# Patient Record
Sex: Male | Born: 1980 | Race: Black or African American | Hispanic: No | Smoking: Current every day smoker
Health system: Southern US, Community
[De-identification: ages and names within clinical notes are randomized; demographics above are authoritative.]

## PROBLEM LIST (undated history)

## (undated) DIAGNOSIS — M545 Low back pain, unspecified: Secondary | ICD-10-CM

## (undated) DIAGNOSIS — G43909 Migraine, unspecified, not intractable, without status migrainosus: Secondary | ICD-10-CM

## (undated) DIAGNOSIS — G4700 Insomnia, unspecified: Secondary | ICD-10-CM

## (undated) DIAGNOSIS — F329 Major depressive disorder, single episode, unspecified: Secondary | ICD-10-CM

## (undated) DIAGNOSIS — G8929 Other chronic pain: Secondary | ICD-10-CM

## (undated) DIAGNOSIS — F32A Depression, unspecified: Secondary | ICD-10-CM

## (undated) HISTORY — DX: Major depressive disorder, single episode, unspecified: F32.9

## (undated) HISTORY — PX: APPENDECTOMY: SHX54

## (undated) HISTORY — DX: Low back pain, unspecified: M54.50

## (undated) HISTORY — DX: Other chronic pain: G89.29

## (undated) HISTORY — DX: Depression, unspecified: F32.A

## (undated) HISTORY — DX: Migraine, unspecified, not intractable, without status migrainosus: G43.909

## (undated) HISTORY — DX: Low back pain: M54.5

## (undated) HISTORY — DX: Insomnia, unspecified: G47.00

---

## 2013-12-17 HISTORY — PX: CHOLECYSTECTOMY: SHX55

## 2014-05-15 ENCOUNTER — Ambulatory Visit: Payer: BC Managed Care – PPO | Attending: Internal Medicine | Admitting: Physical Therapy

## 2014-05-15 DIAGNOSIS — Z9089 Acquired absence of other organs: Secondary | ICD-10-CM | POA: Insufficient documentation

## 2014-05-15 DIAGNOSIS — IMO0001 Reserved for inherently not codable concepts without codable children: Secondary | ICD-10-CM | POA: Insufficient documentation

## 2014-05-15 DIAGNOSIS — R5381 Other malaise: Secondary | ICD-10-CM | POA: Diagnosis not present

## 2014-05-15 DIAGNOSIS — M542 Cervicalgia: Secondary | ICD-10-CM | POA: Diagnosis not present

## 2014-05-17 ENCOUNTER — Ambulatory Visit: Payer: BC Managed Care – PPO | Attending: Internal Medicine | Admitting: *Deleted

## 2014-05-17 DIAGNOSIS — Z9089 Acquired absence of other organs: Secondary | ICD-10-CM | POA: Insufficient documentation

## 2014-05-17 DIAGNOSIS — R5381 Other malaise: Secondary | ICD-10-CM | POA: Diagnosis not present

## 2014-05-17 DIAGNOSIS — M542 Cervicalgia: Secondary | ICD-10-CM | POA: Diagnosis not present

## 2014-05-17 DIAGNOSIS — IMO0001 Reserved for inherently not codable concepts without codable children: Secondary | ICD-10-CM | POA: Diagnosis present

## 2014-05-22 ENCOUNTER — Encounter: Payer: BC Managed Care – PPO | Admitting: *Deleted

## 2014-05-29 ENCOUNTER — Encounter: Payer: BC Managed Care – PPO | Admitting: *Deleted

## 2014-05-31 ENCOUNTER — Encounter: Payer: BC Managed Care – PPO | Admitting: Physical Therapy

## 2014-08-27 ENCOUNTER — Telehealth: Payer: Self-pay | Admitting: Family Medicine

## 2014-08-27 NOTE — Telephone Encounter (Signed)
Pt states he did not call our office for appt.

## 2014-08-28 NOTE — Telephone Encounter (Addendum)
Patient has [employer]- he is currently taking nortriptyline, hydrocodone, dr. Daleen SquibbWall the neurologist prescribes the nortriptyline and hydrocodone came from urgent care. He was seeing a primary care in [place] and has moved to [place] and wants to establish a pcp here in town.  Appointment given for 1/27 with Dr. Hyacinth MeekerMiller and patient will bring records with him

## 2014-09-03 ENCOUNTER — Ambulatory Visit: Payer: BC Managed Care – PPO | Attending: Neurology | Admitting: Physical Therapy

## 2014-09-03 DIAGNOSIS — M545 Low back pain: Secondary | ICD-10-CM | POA: Insufficient documentation

## 2014-09-11 ENCOUNTER — Encounter: Payer: BC Managed Care – PPO | Admitting: *Deleted

## 2014-09-12 ENCOUNTER — Ambulatory Visit: Payer: BC Managed Care – PPO | Admitting: Physical Therapy

## 2014-09-12 DIAGNOSIS — M545 Low back pain: Secondary | ICD-10-CM | POA: Diagnosis not present

## 2014-09-18 ENCOUNTER — Encounter: Payer: BC Managed Care – PPO | Admitting: Physical Therapy

## 2014-09-25 ENCOUNTER — Ambulatory Visit: Payer: BC Managed Care – PPO | Attending: Neurology | Admitting: Physical Therapy

## 2014-09-25 DIAGNOSIS — M545 Low back pain: Secondary | ICD-10-CM | POA: Insufficient documentation

## 2014-11-14 ENCOUNTER — Ambulatory Visit (INDEPENDENT_AMBULATORY_CARE_PROVIDER_SITE_OTHER): Payer: BLUE CROSS/BLUE SHIELD | Admitting: Family Medicine

## 2014-11-14 ENCOUNTER — Encounter: Payer: Self-pay | Admitting: Family Medicine

## 2014-11-14 VITALS — BP 110/69 | HR 78 | Temp 98.7°F | Ht 72.0 in | Wt 204.0 lb

## 2014-11-14 DIAGNOSIS — G8929 Other chronic pain: Secondary | ICD-10-CM | POA: Insufficient documentation

## 2014-11-14 DIAGNOSIS — M545 Low back pain, unspecified: Secondary | ICD-10-CM

## 2014-11-14 DIAGNOSIS — G43909 Migraine, unspecified, not intractable, without status migrainosus: Secondary | ICD-10-CM | POA: Insufficient documentation

## 2014-11-14 DIAGNOSIS — G44229 Chronic tension-type headache, not intractable: Secondary | ICD-10-CM

## 2014-11-14 DIAGNOSIS — G4700 Insomnia, unspecified: Secondary | ICD-10-CM | POA: Insufficient documentation

## 2014-11-14 MED ORDER — CYCLOBENZAPRINE HCL 10 MG PO TABS: 10.0000 mg | ORAL_TABLET | Freq: Every day | ORAL | Status: DC
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
## 2014-12-07 ENCOUNTER — Encounter: Payer: Self-pay | Admitting: Family Medicine

## 2014-12-07 ENCOUNTER — Telehealth: Payer: Self-pay | Admitting: Family Medicine

## 2014-12-07 ENCOUNTER — Ambulatory Visit (INDEPENDENT_AMBULATORY_CARE_PROVIDER_SITE_OTHER): Payer: BLUE CROSS/BLUE SHIELD | Admitting: Family Medicine

## 2014-12-07 VITALS — BP 114/70 | HR 89 | Temp 98.2°F | Ht 72.0 in | Wt 198.0 lb

## 2014-12-07 DIAGNOSIS — M545 Low back pain: Secondary | ICD-10-CM

## 2014-12-07 DIAGNOSIS — G8929 Other chronic pain: Secondary | ICD-10-CM

## 2014-12-07 NOTE — Progress Notes (Signed)
   Subjective:    Patient ID: Logan Young, male    DOB: 17-Jun-1981, 34 y.o.   MRN: [id number]  HPI  34 year old gentleman here to follow-up back pain. Since his last visit he has been taking muscle relaxant, Flexeril at bedtime. He says that has helped and the pain is better but he still works a very strenuous job and I think some muscular aches and pains could be expected. The headaches which she described as migraines have definitely improved and are much less frequent on the nortriptyline that neurologist has given him. Today he talks about some left-sided chest pain. He does smoke a pack a day but has no other risk factors for heart disease. The chest pain is not necessarily related to exertion. There is no cough or congestion.  Patient Active Problem List   Diagnosis Date Noted  . Migraines   . Insomnia   . Chronic low back pain    Outpatient Encounter Prescriptions as of 12/07/2014  Medication Sig  . cyclobenzaprine (FLEXERIL) 10 MG tablet Take 1 tablet (10 mg total) by mouth at bedtime.  . nortriptyline (PAMELOR) 25 MG capsule Take 50 mg by mouth at bedtime.     Review of Systems  Cardiovascular: Positive for chest pain.  Musculoskeletal: Positive for back pain.  Neurological: Positive for headaches.       Objective:   Physical Exam  Constitutional: He is oriented to person, place, and time. He appears well-developed and well-nourished.  HENT:  Head: Normocephalic.  Right Ear: External ear normal.  Left Ear: External ear normal.  Nose: Nose normal.  Mouth/Throat: Oropharynx is clear and moist.  Eyes: Conjunctivae and EOM are normal. Pupils are equal, round, and reactive to light.  Neck: Normal range of motion. Neck supple.  Cardiovascular: Normal rate, regular rhythm, normal heart sounds and intact distal pulses.   Pulmonary/Chest: Effort normal and breath sounds normal.  Abdominal: Soft. Bowel sounds are normal.  Musculoskeletal: Normal range of motion.  Back exam  shows good range of motion. Straight leg testing is negative. Deep tendon reflexes are symmetric in lower extremities.  Neurological: He is alert and oriented to person, place, and time.  Skin: Skin is warm and dry.  Psychiatric: He has a normal mood and affect. His behavior is normal. Judgment and thought content normal.     BP 114/70 mmHg  Pulse 89  Temp(Src) 98.2 F (36.8 C) (Oral)  Ht 6' (1.829 m)  Wt 198 lb (89.812 kg)  BMI 26.85 kg/m2      Assessment & Plan:  1. Chronic low back pain Improved.  Rec stretching exercises  2. Chest pain Tender pectoral muscles.  More likely musculoskeletal pain but did rec stop smoking  Frederica KusterStephen M Miller MD

## 2014-12-07 NOTE — Telephone Encounter (Signed)
Stp advised we don't need to schedule a follow up appt for back pain. The pt can call to schedule appt as needed for the back pain, pt voiced understanding.

## 2015-01-08 ENCOUNTER — Telehealth: Payer: Self-pay | Admitting: Family Medicine

## 2015-01-08 NOTE — Telephone Encounter (Signed)
Lmtb/ww girlfriend is not on Hipp[employer]

## 2015-02-12 ENCOUNTER — Ambulatory Visit (INDEPENDENT_AMBULATORY_CARE_PROVIDER_SITE_OTHER): Payer: BLUE CROSS/BLUE SHIELD | Admitting: Family Medicine

## 2015-02-12 ENCOUNTER — Encounter: Payer: Self-pay | Admitting: Family Medicine

## 2015-02-12 VITALS — BP 121/79 | HR 79 | Temp 97.8°F | Ht 72.0 in | Wt 203.0 lb

## 2015-02-12 DIAGNOSIS — F329 Major depressive disorder, single episode, unspecified: Secondary | ICD-10-CM | POA: Insufficient documentation

## 2015-02-12 DIAGNOSIS — F32A Depression, unspecified: Secondary | ICD-10-CM | POA: Insufficient documentation

## 2015-02-12 MED ORDER — CITALOPRAM HYDROBROMIDE 20 MG PO TABS: 20.0000 mg | ORAL_TABLET | Freq: Every day | ORAL | Status: DC

## 2015-02-12 NOTE — Progress Notes (Signed)
   Subjective:    Patient ID: Logan Young, male    DOB: [birth year], 34 y.o.   MRN: [id number]  HPI 34 year old gentleman with depression. At his first visit we focused on his headaches which seem to be more tension related but definitely improved now nortriptyline. He may get a mild headache once a week but certainly frequency has decreased from 2-3 times a week as well as severity of the headache.  Today he describes problems sleeping, mind racing, temper outbursts and general apathy and lack of interest. There is no family history of depression or bipolar disorder although he did does state that his father was temperamental    Review of Systems  Constitutional: Negative.   HENT: Negative.   Eyes: Negative.   Respiratory: Negative.  Negative for shortness of breath.   Cardiovascular: Negative.  Negative for chest pain and leg swelling.  Gastrointestinal: Negative.   Genitourinary: Negative.   Musculoskeletal: Negative.   Skin: Negative.   Neurological: Negative.   Psychiatric/Behavioral: Positive for decreased concentration and agitation.  All other systems reviewed and are negative.  Patient Active Problem List   Diagnosis Date Noted  . Depression   . Migraines   . Insomnia   . Chronic low back pain    Outpatient Encounter Prescriptions as of 02/12/2015  Medication Sig  . cyclobenzaprine (FLEXERIL) 10 MG tablet Take 1 tablet (10 mg total) by mouth at bedtime.  . nortriptyline (PAMELOR) 25 MG capsule Take 50 mg by mouth at bedtime.  . traMADol (ULTRAM) 50 MG tablet Take 50 mg by mouth daily.      Objective:   Physical Exam  Constitutional: He is oriented to person, place, and time. He appears well-developed and well-nourished.  HENT:  Head: Normocephalic.  Right Ear: External ear normal.  Left Ear: External ear normal.  Nose: Nose normal.  Mouth/Throat: Oropharynx is clear and moist.  Eyes: Conjunctivae and EOM are normal. Pupils are equal, round, and reactive to light.    Neck: Normal range of motion. Neck supple.  Cardiovascular: Normal rate, regular rhythm, normal heart sounds and intact distal pulses.   Pulmonary/Chest: Effort normal and breath sounds normal.  Abdominal: Soft. Bowel sounds are normal.  Musculoskeletal: Normal range of motion.  Neurological: He is alert and oriented to person, place, and time.  Skin: Skin is warm and dry.  Psychiatric: He has a normal mood and affect. His behavior is normal. Judgment and thought content normal.          Assessment & Plan:  1. Depression Patient seems clinically depressed. Appointments have been made with psychologist in the past on several occasions that he did not keep. He says that he is willing to take an antidepressant today but also have my doubts that he will follow through with this. We'll begin Celexa 20 mg daily and recheck in one month  Logan KusterStephen M Miller MD

## 2015-03-04 ENCOUNTER — Telehealth: Payer: Self-pay | Admitting: Family Medicine

## 2015-03-04 NOTE — Telephone Encounter (Signed)
Patient has chronic back pain and felt a pop while at work last week.  He was taken to the ER and evaluated. They told him to follow up with his PCP and that an MRI may be needed. Appt scheduled for 5/19 at 10:15. Patient aware. Explained that this is a gray area as far as coverage is concerned since there was a preexisting back condition I will schedule this as a private visit and not WC.

## 2015-03-07 ENCOUNTER — Encounter: Payer: Self-pay | Admitting: Family Medicine

## 2015-03-07 ENCOUNTER — Ambulatory Visit (INDEPENDENT_AMBULATORY_CARE_PROVIDER_SITE_OTHER): Payer: BLUE CROSS/BLUE SHIELD | Admitting: Family Medicine

## 2015-03-07 VITALS — BP 119/75 | HR 88 | Temp 99.1°F | Ht 72.0 in | Wt 204.0 lb

## 2015-03-07 DIAGNOSIS — M545 Low back pain: Secondary | ICD-10-CM
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
## 2015-03-13 ENCOUNTER — Telehealth: Payer: Self-pay | Admitting: Family Medicine

## 2015-03-13 NOTE — Telephone Encounter (Signed)
?  does he need to be seen or provide a note saying pt states he is ready to return

## 2015-03-13 NOTE — Telephone Encounter (Signed)
Patient is requesting note to return back to work on Sunday with no restrictions

## 2015-03-14 NOTE — Telephone Encounter (Signed)
This message is being handled in separate encounter.

## 2015-03-14 NOTE — Telephone Encounter (Signed)
Patient states that he needs a note to return to work even though he will continue to have him on light duty. They just need note stating he may return to work since it has been over 7 days

## 2015-03-19 ENCOUNTER — Ambulatory Visit: Payer: BLUE CROSS/BLUE SHIELD | Admitting: Family Medicine

## 2015-04-03 ENCOUNTER — Telehealth: Payer: Self-pay | Admitting: Family Medicine

## 2015-04-03 NOTE — Telephone Encounter (Signed)
We did not see him as a worker's comp patient unless things have changed since his visit.

## 2015-04-03 NOTE — Telephone Encounter (Signed)
Please review and advise.

## 2015-04-04 ENCOUNTER — Encounter (INDEPENDENT_AMBULATORY_CARE_PROVIDER_SITE_OTHER): Payer: Self-pay

## 2015-04-04 ENCOUNTER — Encounter: Payer: Self-pay | Admitting: Family Medicine

## 2015-04-04 ENCOUNTER — Ambulatory Visit (INDEPENDENT_AMBULATORY_CARE_PROVIDER_SITE_OTHER): Payer: BLUE CROSS/BLUE SHIELD | Admitting: Family Medicine

## 2015-04-04 VITALS — BP 124/83 | HR 81 | Temp 98.8°F | Ht 72.0 in | Wt 211.0 lb

## 2015-04-04 DIAGNOSIS — M545 Low back pain: Secondary | ICD-10-CM

## 2015-04-04 DIAGNOSIS — G8929 Other chronic pain: Secondary | ICD-10-CM | POA: Diagnosis not present

## 2015-04-04 NOTE — Progress Notes (Signed)
   Subjective:    Patient ID: Logan Young, male    DOB: [birth year], 34 y.o.   MRN: [id number]  HPI 34 year old gentleman with persistent back pain. 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 He felt pain immediately in her a pop. He was seen here on May 19. Apparently, Worker's Comp. denied his claim and he has been doing light duty. He still has some persistence of right lumbar pain and radiation to his left leg. He had called yesterday requesting to go back to full-time duty but I felt he needed to be seen before we can release him. He had thought an MRI would be scheduled but since claim was denied by [employer]. and there was some pre-existing back pain there was also a question as to whether or not it would be covered by his [employer].    Review of Systems  Musculoskeletal: Positive for back pain.  Neurological: Positive for headaches.   Patient Active Problem List   Diagnosis Date Noted  . Depression   . Migraines   . Insomnia   . Chronic low back pain    Outpatient Encounter Prescriptions as of 04/04/2015  Medication Sig  . citalopram (CELEXA) 20 MG tablet Take 1 tablet (20 mg total) by mouth daily.  . cyclobenzaprine (FLEXERIL) 10 MG tablet Take 1 tablet (10 mg total) by mouth at bedtime.  . nortriptyline (PAMELOR) 25 MG capsule Take 50 mg by mouth at bedtime.  . traMADol (ULTRAM) 50 MG tablet Take 50 mg by mouth daily.   No facility-administered encounter medications on file as of 04/04/2015.       Objective:   Physical Exam  Musculoskeletal:  Back exam: Normal for flexion. Some pain with lateral bending to left. Straight leg raising equivocal old on left. Deep tendon reflexes are symmetric, at the knees but there is a diminished left ankle jerk reflex compared to the right          Assessment & Plan:  1. Chronic low back pain This is a difficult problem. He probably did suffer a back injury at work but the  claim was denied. I am reluctant to release him to full-time duty given persistence of pain. Since symptoms have persisted greater than one month I will try to order MRI.

## 2015-04-08 ENCOUNTER — Telehealth: Payer: Self-pay | Admitting: Family Medicine

## 2015-04-08 DIAGNOSIS — G8929 Other chronic pain: Secondary | ICD-10-CM

## 2015-04-08 DIAGNOSIS — M545 Low back pain: Principal | ICD-10-CM

## 2015-04-08 NOTE — Telephone Encounter (Signed)
Dr. Hyacinth Meeker please advise.  Left another voicemail at [facility] this morning to determine status of worker's comp.

## 2015-04-08 NOTE — Telephone Encounter (Signed)
Pt notified to Dr. Hyacinth Meeker ordered MRI- and he should receive a call to schedule the appointment.  Per Dr. Hyacinth Meeker- patient can try taking 800 mg ibuprofen along with tramadol to see if this helps with his pain.  He will do a work note that says he cannot return to regular duty right now.

## 2015-04-08 NOTE — Telephone Encounter (Signed)
Girlfriend call and said that Tramadol isn't working. Patient is pacing in pain.  Would like something stronger for back pain.  Also says that he needs a note stating that he can't return to regular duty.

## 2015-04-08 NOTE — Telephone Encounter (Signed)
Since MRI will not be covered by workers comp, let's see if we can order one through his other health insurance

## 2015-04-09 NOTE — Telephone Encounter (Signed)
Faxed to employer per patient's request. [phone]

## 2015-04-15 ENCOUNTER — Telehealth: Payer: Self-pay | Admitting: Family Medicine

## 2015-05-01 ENCOUNTER — Telehealth: Payer: Self-pay | Admitting: Family Medicine

## 2015-05-01 ENCOUNTER — Ambulatory Visit (HOSPITAL_COMMUNITY)
Admission: RE | Admit: 2015-05-01 | Discharge: 2015-05-01 | Disposition: A | Discharge status: Home / Self Care | Payer: BLUE CROSS/BLUE SHIELD | Source: Ambulatory Visit | Attending: Family Medicine | Admitting: Family Medicine

## 2015-05-01 DIAGNOSIS — M545 Low back pain, unspecified: Secondary | ICD-10-CM

## 2015-05-01 DIAGNOSIS — G8929 Other chronic pain: Secondary | ICD-10-CM | POA: Diagnosis not present

## 2015-05-02 NOTE — Telephone Encounter (Signed)
-----   Message from Frederica KusterStephen M Miller, MD sent at 05/02/2015  7:58 AM EDT ----- He has some disc protrusions but no surgical lesion

## 2015-05-03 ENCOUNTER — Telehealth: Payer: Self-pay | Admitting: Family Medicine

## 2015-05-03 NOTE — Telephone Encounter (Signed)
I think we can release him to go back to work with the knowledge that his back symptoms will probably recur intermittently given results of MRI

## 2015-05-03 NOTE — Telephone Encounter (Signed)
Pt notified of MRI results Can he go back to work Please advise

## 2015-05-06 ENCOUNTER — Telehealth: Payer: Self-pay | Admitting: Family Medicine

## 2015-05-06 NOTE — Telephone Encounter (Signed)
Appointment given for tomorrow with Logan Young

## 2015-05-07 ENCOUNTER — Encounter: Payer: Self-pay | Admitting: Family Medicine

## 2015-05-07 ENCOUNTER — Encounter: Payer: Self-pay | Admitting: *Deleted

## 2015-05-07 ENCOUNTER — Ambulatory Visit (INDEPENDENT_AMBULATORY_CARE_PROVIDER_SITE_OTHER): Payer: BLUE CROSS/BLUE SHIELD | Admitting: Family Medicine

## 2015-05-07 VITALS — BP 125/83 | HR 81 | Temp 97.7°F | Ht 72.0 in | Wt 213.0 lb

## 2015-05-07 DIAGNOSIS — M519 Unspecified thoracic, thoracolumbar and lumbosacral intervertebral disc disorder: Secondary | ICD-10-CM | POA: Diagnosis not present

## 2015-05-07 NOTE — Progress Notes (Signed)
   Subjective:    Patient ID: Logan MattersRobert Sherrod, male    DOB: October 19, 1981, 34 y.o.   MRN: [id number]  HPI Patient here today to discuss back pain and the need for a return to work note.  Patient is ready to go back to work. We finally got MRI that demonstrates discs at several levels in his back I explained to him that symptoms may come and go and he needs to practice good back hygiene is much as possible but he does have a strenuous work on the job.      Patient Active Problem List   Diagnosis Date Noted  . Depression   . Migraines   . Insomnia   . Chronic low back pain    Outpatient Encounter Prescriptions as of 05/07/2015  Medication Sig  . citalopram (CELEXA) 20 MG tablet Take 1 tablet (20 mg total) by mouth daily.  . cyclobenzaprine (FLEXERIL) 10 MG tablet Take 1 tablet (10 mg total) by mouth at bedtime.  . nortriptyline (PAMELOR) 25 MG capsule Take 50 mg by mouth at bedtime.  . traMADol (ULTRAM) 50 MG tablet Take 50 mg by mouth daily.   No facility-administered encounter medications on file as of 05/07/2015.      Review of Systems  Constitutional: Negative.   HENT: Negative.   Eyes: Negative.   Respiratory: Negative.   Cardiovascular: Negative.   Gastrointestinal: Negative.   Endocrine: Negative.   Genitourinary: Negative.   Musculoskeletal: Positive for back pain.  Skin: Negative.   Allergic/Immunologic: Negative.   Neurological: Negative.   Hematological: Negative.   Psychiatric/Behavioral: Negative.        Objective:   Physical Exam  Constitutional: He appears well-developed and well-nourished.  Musculoskeletal:  Back exam: Normal range of motion. Straight leg raising is negative Deep tendon reflexes are symmetric at the knees and ankles.    BP 125/83 mmHg  Pulse 81  Temp(Src) 97.7 F (36.5 C) (Oral)  Ht 6' (1.829 m)  Wt 213 lb (96.616 kg)  BMI 28.88 kg/m2       Assessment & Plan:  1. Lumbar disc disease Will return to work with warning that  symptoms may recur. If he develops radiculopathy or has any limitation to function particularly in the lower extremities he needs to be seen immediately  Frederica KusterStephen M Miller MD

## 2015-05-08 ENCOUNTER — Telehealth: Payer: Self-pay | Admitting: Family Medicine

## 2015-05-10 NOTE — Telephone Encounter (Signed)
Patient will have [employer] send forms.  We have no forms to fill out but will prepare and give to patient or send back to insurance.  Patient returned to work on 05-09-2015.  He needs documentation to cover through the [address] for out of work.

## 2015-05-10 NOTE — Telephone Encounter (Signed)
Patient is aware of results and returned to work on 05-09-2015.

## 2015-06-12 ENCOUNTER — Encounter: Payer: Self-pay | Admitting: Family Medicine

## 2015-06-12 ENCOUNTER — Ambulatory Visit (INDEPENDENT_AMBULATORY_CARE_PROVIDER_SITE_OTHER): Payer: BLUE CROSS/BLUE SHIELD | Admitting: Family Medicine

## 2015-06-12 VITALS — BP 118/80 | HR 76 | Temp 97.4°F | Ht 72.0 in | Wt 215.0 lb

## 2015-06-12 DIAGNOSIS — F32A Depression, unspecified: Secondary | ICD-10-CM

## 2015-06-12 DIAGNOSIS — F329 Major depressive disorder, single episode, unspecified: Secondary | ICD-10-CM | POA: Diagnosis not present

## 2015-06-12 DIAGNOSIS — M545 Low back pain, unspecified: Secondary | ICD-10-CM | POA: Insufficient documentation

## 2015-06-12 MED ORDER — TIZANIDINE HCL 2 MG PO CAPS: 2.0000 mg | ORAL_CAPSULE | Freq: Three times a day (TID) | ORAL | Status: DC

## 2015-06-12 MED ORDER — CITALOPRAM HYDROBROMIDE 20 MG PO TABS: 20.0000 mg | ORAL_TABLET | Freq: Every day | ORAL | Status: DC

## 2015-06-12 MED ORDER — TRAMADOL HCL 50 MG PO TABS: 50.0000 mg | ORAL_TABLET | Freq: Every day | ORAL | Status: DC

## 2015-06-12 NOTE — Patient Instructions (Signed)
Back Pain, Adult Low back pain is very common. About 1 in 5 people have back pain.The cause of low back pain is rarely dangerous. The pain often gets better over time.About half of people with a sudden onset of back pain feel better in just 2 weeks. About 8 in 10 people feel better by 6 weeks.  CAUSES Some common causes of back pain include:  Strain of the muscles or ligaments supporting the spine.  Wear and tear (degeneration) of the spinal discs.  Arthritis.  Direct injury to the back. DIAGNOSIS Most of the time, the direct cause of low back pain is not known.However, back pain can be treated effectively even when the exact cause of the pain is unknown.Answering your caregiver's questions about your overall health and symptoms is one of the most accurate ways to make sure the cause of your pain is not dangerous. If your caregiver needs more information, he or she may order lab work or imaging tests (X-rays or MRIs).However, even if imaging tests show changes in your back, this usually does not require surgery. HOME CARE INSTRUCTIONS For many people, back pain returns.Since low back pain is rarely dangerous, it is often a condition that people can learn to manageon their own.   Remain active. It is stressful on the back to sit or stand in one place. Do not sit, drive, or stand in one place for more than 30 minutes at a time. Take short walks on level surfaces as soon as pain allows.Try to increase the length of time you walk each day.  Do not stay in bed.Resting more than 1 or 2 days can delay your recovery.  Do not avoid exercise or work.Your body is made to move.It is not dangerous to be active, even though your back may hurt.Your back will likely heal faster if you return to being active before your pain is gone.  Pay attention to your body when you bend and lift. Many people have less discomfortwhen lifting if they bend their knees, keep the load close to their bodies,and  avoid twisting. Often, the most comfortable positions are those that put less stress on your recovering back.  Find a comfortable position to sleep. Use a firm mattress and lie on your side with your knees slightly bent. If you lie on your back, put a pillow under your knees.  Only take over-the-counter or prescription medicines as directed by your caregiver. Over-the-counter medicines to reduce pain and inflammation are often the most helpful.Your caregiver may prescribe muscle relaxant drugs.These medicines help dull your pain so you can more quickly return to your normal activities and healthy exercise.  Put ice on the injured area.  Put ice in a plastic bag.  Place a towel between your skin and the bag.  Leave the ice on for 15-20 minutes, 03-04 times a day for the first 2 to 3 days. After that, ice and heat may be alternated to reduce pain and spasms.  Ask your caregiver about trying back exercises and gentle massage. This may be of some benefit.  Avoid feeling anxious or stressed.Stress increases muscle tension and can worsen back pain.It is important to recognize when you are anxious or stressed and learn ways to manage it.Exercise is a great option. SEEK MEDICAL CARE IF:  You have pain that is not relieved with rest or medicine.  You have pain that does not improve in 1 week.  You have new symptoms.  You are generally not feeling well. SEEK   IMMEDIATE MEDICAL CARE IF:   You have pain that radiates from your back into your legs.  You develop new bowel or bladder control problems.  You have unusual weakness or numbness in your arms or legs.  You develop nausea or vomiting.  You develop abdominal pain.  You feel faint. Document Released: 10/05/2005 Document Revised: 04/05/2012 Document Reviewed: 02/06/2014 Exit[employer] Patient Information 2015 [employer]. This information is not intended to replace advice given to you by your health care [place]. Make sure you  discuss any questions you have with your health care Treylin Burtch.  

## 2015-06-12 NOTE — Assessment & Plan Note (Signed)
Patient admits to having some component of depression because of his back pain his work situation. He denies any suicidal ideations and is currently happy with Celexa. We'll refill

## 2015-06-12 NOTE — Assessment & Plan Note (Signed)
Patient has been having this pain for the past 2 years. Attempted short courses of physical therapy twice and has tried different medications without much success. Discussed the need for daily exercises and stretches and physical therapy. He is hesitant towards physical therapy because of the pain that they cause, instructed him that there does have to be some pain initially to be worked through before it will improve but he needs to stick with it. We'll also try Zanaflex and tramadol.
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
## 2015-06-20 ENCOUNTER — Telehealth: Payer: Self-pay | Admitting: *Deleted

## 2015-06-20 DIAGNOSIS — M545 Low back pain, unspecified: Secondary | ICD-10-CM

## 2015-06-20 DIAGNOSIS — G8929 Other chronic pain: Secondary | ICD-10-CM

## 2015-06-20 NOTE — Telephone Encounter (Signed)
Okay to refer to pain clinic

## 2015-11-18 ENCOUNTER — Ambulatory Visit (INDEPENDENT_AMBULATORY_CARE_PROVIDER_SITE_OTHER): Payer: BLUE CROSS/BLUE SHIELD | Admitting: Family Medicine

## 2015-11-18 ENCOUNTER — Encounter: Payer: Self-pay | Admitting: Family Medicine

## 2015-11-18 VITALS — BP 107/71 | HR 81 | Temp 98.2°F | Ht 72.0 in | Wt 205.8 lb

## 2015-11-18 DIAGNOSIS — J029 Acute pharyngitis, unspecified: Secondary | ICD-10-CM | POA: Diagnosis not present

## 2015-11-18 DIAGNOSIS — Z029 Encounter for administrative examinations, unspecified: Secondary | ICD-10-CM

## 2015-11-18 MED ORDER — FLUTICASONE PROPIONATE 50 MCG/ACT NA SUSP: 1.0000 | Freq: Two times a day (BID) | NASAL | Status: DC | PRN

## 2015-11-18 MED ORDER — TIZANIDINE HCL 2 MG PO CAPS: 2.0000 mg | ORAL_CAPSULE | Freq: Three times a day (TID) | ORAL | Status: DC

## 2015-11-18 NOTE — Progress Notes (Signed)
BP 107/71 mmHg  Pulse 81  Temp(Src) 98.2 F (36.8 C) (Oral)  Ht 6' (1.829 m)  Wt 205 lb 12.8 oz (93.35 kg)  BMI 27.91 kg/m2   Subjective:    Patient ID: Logan Young, male    DOB: [birth year], 35 y.o.   MRN: [id number]  HPI: Logan Young is a 35 y.o. male presenting on 11/18/2015 for Cough; Fever; Sinusitis; and Generalized Body Aches   HPI Sinus congestion and cough and body aches Patient has been having sinus congestion and cough and fevers for the past 5 days. His fever broke finally 2 days ago and he has not had any fever since. He just been having the sinus congestion and cough since that point. He also had myalgias and achiness. He does not know any sick contacts. He has been using some over-the-counter DayQuil and NyQuil and they have been helping some. He is also been using ibuprofen and Advil for the fevers. He does not have any shortness of breath or wheezing  Relevant past medical, surgical, family and social history reviewed and updated as indicated. Interim medical history since our last visit reviewed. Allergies and medications reviewed and updated.  Review of Systems  Constitutional: Positive for fever and chills.  HENT: Positive for congestion, postnasal drip, rhinorrhea, sinus pressure and sore throat. Negative for ear discharge, ear pain, sneezing and voice change.   Eyes: Negative for pain, discharge, redness and visual disturbance.  Respiratory: Positive for cough. Negative for chest tightness, shortness of breath and wheezing.   Cardiovascular: Negative for chest pain and leg swelling.  Gastrointestinal: Negative for abdominal pain, diarrhea and constipation.  Genitourinary: Negative for difficulty urinating.  Musculoskeletal: Negative for back pain and gait problem.  Skin: Negative for rash.  Neurological: Negative for syncope, light-headedness and headaches.  All other systems reviewed and are negative.   Per HPI unless specifically indicated above       Medication List       This list is accurate as of: 11/18/15 11:42 AM.  Always use your most recent med list.               fluticasone 50 MCG/ACT nasal spray  Commonly known as:  FLONASE  Place 1 spray into both nostrils 2 (two) times daily as needed for allergies or rhinitis.     tizanidine 2 MG capsule  Commonly known as:  ZANAFLEX  Take 1 capsule (2 mg total) by mouth 3 (three) times daily.           Objective:    BP 107/71 mmHg  Pulse 81  Temp(Src) 98.2 F (36.8 C) (Oral)  Ht 6' (1.829 m)  Wt 205 lb 12.8 oz (93.35 kg)  BMI 27.91 kg/m2  Wt Readings from Last 3 Encounters:  11/18/15 205 lb 12.8 oz (93.35 kg)  06/12/15 215 lb (97.523 kg)  05/07/15 213 lb (96.616 kg)    Physical Exam  Constitutional: He is oriented to person, place, and time. He appears well-developed and well-nourished. No distress.  HENT:  Right Ear: Tympanic membrane, external ear and ear canal normal.  Left Ear: Tympanic membrane, external ear and ear canal normal.  Nose: Mucosal edema and rhinorrhea present. No sinus tenderness. No epistaxis. Right sinus exhibits no maxillary sinus tenderness and no frontal sinus tenderness. Left sinus exhibits no maxillary sinus tenderness and no frontal sinus tenderness.  Mouth/Throat: Uvula is midline and mucous membranes are normal. Posterior oropharyngeal edema and posterior oropharyngeal erythema present. No oropharyngeal exudate or tonsillar  abscesses.  Eyes: Conjunctivae and EOM are normal. Pupils are equal, round, and reactive to light. Right eye exhibits no discharge. No scleral icterus.  Cardiovascular: Normal rate, regular rhythm, normal heart sounds and intact distal pulses.   No murmur heard. Pulmonary/Chest: Effort normal and breath sounds normal. No respiratory distress. He has no wheezes.  Abdominal: He exhibits no distension.  Musculoskeletal: Normal range of motion. He exhibits no edema.  Neurological: He is alert and oriented to person, place,  and time. Coordination normal.  Skin: Skin is warm and dry. No rash noted. He is not diaphoretic.  Psychiatric: He has a normal mood and affect. His behavior is normal.  Vitals reviewed.   No results found for this or any previous visit.    Assessment & Plan:       Problem List Items Addressed This Visit    None    Visit Diagnoses    Acute pharyngitis, unspecified etiology    -  Primary    Patient is on the tail end of 5 days of fever, sounds like influenza, treat symptomatically    Relevant Medications    tizanidine (ZANAFLEX) 2 MG capsule    fluticasone (FLONASE) 50 MCG/ACT nasal spray        Follow up plan: Return if symptoms worsen or fail to improve.  Counseling provided for all of the vaccine components No orders of the defined types were placed in this encounter.    Arville Care, MD [facility] 11/18/2015, 11:42 AM

## 2015-11-19 ENCOUNTER — Telehealth: Payer: Self-pay | Admitting: Family Medicine

## 2015-11-19 ENCOUNTER — Ambulatory Visit (INDEPENDENT_AMBULATORY_CARE_PROVIDER_SITE_OTHER): Payer: BLUE CROSS/BLUE SHIELD | Admitting: Family

## 2015-11-19 ENCOUNTER — Ambulatory Visit (INDEPENDENT_AMBULATORY_CARE_PROVIDER_SITE_OTHER): Payer: BLUE CROSS/BLUE SHIELD

## 2015-11-19 ENCOUNTER — Encounter: Payer: Self-pay | Admitting: Family

## 2015-11-19 VITALS — BP 135/75 | HR 129 | Temp 101.8°F | Ht 72.0 in | Wt 203.0 lb

## 2015-11-19 DIAGNOSIS — R0602 Shortness of breath: Secondary | ICD-10-CM

## 2015-11-19 DIAGNOSIS — R6889 Other general symptoms and signs: Secondary | ICD-10-CM | POA: Diagnosis not present

## 2015-11-19 DIAGNOSIS — R509 Fever, unspecified: Secondary | ICD-10-CM

## 2015-11-19 DIAGNOSIS — J189 Pneumonia, unspecified organism: Secondary | ICD-10-CM

## 2015-11-19 LAB — POCT INFLUENZA A/B
Influenza A, POC: NEGATIVE
Influenza B, POC: NEGATIVE

## 2015-11-19 LAB — POCT RAPID STREP A (OFFICE): RAPID STREP A SCREEN: NEGATIVE

## 2015-11-19 MED ORDER — LEVOFLOXACIN 500 MG PO TABS: 500.0000 mg | ORAL_TABLET | Freq: Every day | ORAL | Status: DC

## 2015-11-19 MED ORDER — CEFTRIAXONE SODIUM 1 G IJ SOLR
1.0000 g | Freq: Once | INTRAMUSCULAR | Status: AC
  Administered 2015-11-19: 1 g via INTRAMUSCULAR

## 2015-11-19 MED ORDER — PREDNISONE 20 MG PO TABS: ORAL_TABLET | ORAL | Status: DC

## 2015-11-19 NOTE — Patient Instructions (Signed)

## 2015-11-19 NOTE — Progress Notes (Signed)
   Subjective:    Patient ID: Logan Young, male    DOB: 25-Jun-1981, 35 y.o.   MRN: [id number]  Fever  This is a new problem. The current episode started in the past 7 days (Wednesday). The problem occurs constantly. The problem has been gradually improving. The maximum temperature noted was 102 to 102.9 F. Associated symptoms include congestion, coughing, headaches, muscle aches, nausea, sleepiness and wheezing. Pertinent negatives include no diarrhea, sore throat or vomiting. He has tried fluids and acetaminophen for the symptoms. The treatment provided mild relief.      Review of Systems  Constitutional: Positive for fever.  HENT: Positive for congestion. Negative for sore throat.   Respiratory: Positive for cough and wheezing.   Cardiovascular: Negative.   Gastrointestinal: Positive for nausea. Negative for vomiting and diarrhea.  Endocrine: Negative.   Genitourinary: Negative.   Musculoskeletal: Negative.   Neurological: Positive for headaches.  Hematological: Negative.   Psychiatric/Behavioral: Negative.   All other systems reviewed and are negative.      Objective:   Physical Exam  Constitutional: He is oriented to person, place, and time. He appears well-developed and well-nourished. He has a sickly appearance. He appears ill. No distress.  HENT:  Head: Normocephalic.  Right Ear: External ear normal.  Left Ear: External ear normal.  Mouth/Throat: Oropharynx is clear and moist.  Nasal passage erythemas with moderate  swelling    Eyes: Pupils are equal, round, and reactive to light. Right eye exhibits no discharge. Left eye exhibits no discharge.  Neck: Normal range of motion. Neck supple. No thyromegaly present.  Cardiovascular: Normal rate, regular rhythm, normal heart sounds and intact distal pulses.   No murmur heard. Pulmonary/Chest: Effort normal. No respiratory distress. He has wheezes.  Diminished breath sounds, rhonchi noted   Abdominal: Soft. Bowel sounds are  normal. He exhibits no distension. There is no tenderness.  Musculoskeletal: Normal range of motion. He exhibits no edema or tenderness.  Neurological: He is alert and oriented to person, place, and time. He has normal reflexes. No cranial nerve deficit.  Skin: Skin is warm and dry. No rash noted. No erythema.  Psychiatric: He has a normal mood and affect. His behavior is normal. Judgment and thought content normal.  Vitals reviewed.  Chest X-ray-  Right pneumonia? Preliminary reading by Jannifer Rodney, FNP [facility]    BP 135/75 mmHg  Pulse 129  Temp(Src) 101.8 F (38.8 C) (Oral)  Ht 6' (1.829 m)  Wt 203 lb (92.08 kg)  BMI 27.53 kg/m2     Assessment & Plan:  1. Flu-like symptoms - POCT rapid strep A - POCT Influenza A/B  2. Fever, unspecified fever cause - DG Chest 2 View; Future  3. SOB (shortness of breath) - DG Chest 2 View; Future  4. CAP (community acquired pneumonia) -PT advised to stop smoking -Force fluids -Alternate tylenol and motrin to keep fever down -Rest -RTO in 3 days to recheck - cefTRIAXone (ROCEPHIN) injection 1 g; Inject 1 g into the muscle once. - levofloxacin (LEVAQUIN) 500 MG tablet; Take 1 tablet (500 mg total) by mouth daily.  Dispense: 7 tablet; Refill: 0 - predniSONE (DELTASONE) 20 MG tablet; 2 po at sametime daily for 5 days- start tomorrow  Dispense: 10 tablet; Refill: 0  Jannifer Rodney, FNP

## 2015-12-13 ENCOUNTER — Encounter: Payer: Self-pay | Admitting: Family Medicine

## 2015-12-13 ENCOUNTER — Ambulatory Visit (INDEPENDENT_AMBULATORY_CARE_PROVIDER_SITE_OTHER): Payer: BLUE CROSS/BLUE SHIELD | Admitting: Family Medicine

## 2015-12-13 VITALS — BP 127/84 | HR 97 | Temp 99.6°F | Ht 72.0 in | Wt 204.4 lb

## 2015-12-13 DIAGNOSIS — J111 Influenza due to unidentified influenza virus with other respiratory manifestations: Secondary | ICD-10-CM

## 2015-12-13 DIAGNOSIS — Z0289 Encounter for other administrative examinations: Secondary | ICD-10-CM

## 2015-12-13 MED ORDER — OSELTAMIVIR PHOSPHATE 75 MG PO CAPS: 75.0000 mg | ORAL_CAPSULE | Freq: Two times a day (BID) | ORAL | Status: DC

## 2015-12-13 NOTE — Progress Notes (Signed)
BP 127/84 mmHg  Pulse 97  Temp(Src) 99.6 F (37.6 C) (Oral)  Ht 6' (1.829 m)  Wt 204 lb 6.4 oz (92.715 kg)  BMI 27.72 kg/m2   Subjective:    Patient ID: Logan Young, male    DOB: [birth year], 35 y.o.   MRN: [id number]  HPI: Logan Young is a 35 y.o. male presenting on 12/13/2015 for Positive flu test last night at his employer, [employer], need   HPI Cough and congestion and fevers and aches  Patient presents today because he has been having cough and congestion and fevers and aches and chills and sore throat for the past 2 days. His cough is productive of yellow-green sputum. He denies any shortness of breath or wheezing. His fever is been as high as 102 yesterday. 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  Relevant past medical, surgical, family and social history reviewed and updated as indicated. Interim medical history since our last visit reviewed. Allergies and medications reviewed and updated.  Review of Systems  Constitutional: Positive for fever and chills.  HENT: Positive for congestion, postnasal drip, rhinorrhea, sinus pressure, sneezing and sore throat. Negative for ear discharge, ear pain and voice change.   Eyes: Negative for pain, discharge, redness and visual disturbance.  Respiratory: Positive for cough. Negative for chest tightness, shortness of breath and wheezing.   Cardiovascular: Negative for chest pain and leg swelling.  Gastrointestinal: Negative for abdominal pain, diarrhea and constipation.  Genitourinary: Negative for difficulty urinating.  Musculoskeletal: Negative for back pain and gait problem.  Skin: Negative for rash.  Neurological: Negative for syncope, light-headedness and headaches.  All other systems reviewed and are negative.   Per HPI unless specifically indicated above     Medication List       This list is accurate as of: 12/13/15  1:35 PM.  Always use your most recent  med list.               oseltamivir 75 MG capsule  Commonly known as:  TAMIFLU  Take 1 capsule (75 mg total) by mouth 2 (two) times daily.           Objective:    BP 127/84 mmHg  Pulse 97  Temp(Src) 99.6 F (37.6 C) (Oral)  Ht 6' (1.829 m)  Wt 204 lb 6.4 oz (92.715 kg)  BMI 27.72 kg/m2  Wt Readings from Last 3 Encounters:  12/13/15 204 lb 6.4 oz (92.715 kg)  11/19/15 203 lb (92.08 kg)  11/18/15 205 lb 12.8 oz (93.35 kg)    Physical Exam  Constitutional: He is oriented to person, place, and time. He appears well-developed and well-nourished. No distress.  HENT:  Right Ear: Tympanic membrane, external ear and ear canal normal.  Left Ear: Tympanic membrane, external ear and ear canal normal.  Nose: Mucosal edema and rhinorrhea present. No sinus tenderness. No epistaxis. Right sinus exhibits maxillary sinus tenderness. Right sinus exhibits no frontal sinus tenderness. Left sinus exhibits maxillary sinus tenderness. Left sinus exhibits no frontal sinus tenderness.  Mouth/Throat: Uvula is midline and mucous membranes are normal. Posterior oropharyngeal edema and posterior oropharyngeal erythema present. No oropharyngeal exudate or tonsillar abscesses.  Eyes: Conjunctivae and EOM are normal. Pupils are equal, round, and reactive to light. Right eye exhibits no discharge. No scleral icterus.  Neck: Neck supple. No thyromegaly present.  Cardiovascular: Normal rate, regular rhythm, normal heart sounds and intact distal  pulses.   No murmur heard. Pulmonary/Chest: Effort normal and breath sounds normal. No respiratory distress. He has no wheezes.  Musculoskeletal: Normal range of motion. He exhibits no edema.  Lymphadenopathy:    He has no cervical adenopathy.  Neurological: He is alert and oriented to person, place, and time. Coordination normal.  Skin: Skin is warm and dry. No rash noted. He is not diaphoretic.  Psychiatric: He has a normal mood and affect. His behavior is normal.    Vitals reviewed.   Results for orders placed or performed in visit on 11/19/15  POCT rapid strep A  Result Value Ref Range   Rapid Strep A Screen Negative Negative  POCT Influenza A/B  Result Value Ref Range   Influenza A, POC Negative Negative   Influenza B, POC Negative Negative      Assessment & Plan:   Problem List Items Addressed This Visit    None    Visit Diagnoses    Influenza with respiratory manifestation    -  Primary    Relevant Medications    oseltamivir (TAMIFLU) 75 MG capsule        Follow up plan: Return if symptoms worsen or fail to improve.  Counseling provided for all of the vaccine components No orders of the defined types were placed in this encounter.    Arville Care, MD [facility] 12/13/2015, 1:35 PM

## 2016-01-10 ENCOUNTER — Encounter: Payer: Self-pay | Admitting: Family

## 2016-01-10 ENCOUNTER — Ambulatory Visit (INDEPENDENT_AMBULATORY_CARE_PROVIDER_SITE_OTHER): Payer: BLUE CROSS/BLUE SHIELD | Admitting: Family

## 2016-01-10 VITALS — BP 122/75 | HR 74 | Temp 97.2°F | Ht 72.0 in | Wt 210.0 lb

## 2016-01-10 DIAGNOSIS — M5441 Lumbago with sciatica, right side: Secondary | ICD-10-CM

## 2016-01-10 MED ORDER — CYCLOBENZAPRINE HCL 10 MG PO TABS
10.0000 mg | ORAL_TABLET | Freq: Three times a day (TID) | ORAL | Status: DC | PRN
Start: 2016-01-10 — End: 2017-03-24

## 2016-01-10 MED ORDER — NAPROXEN 500 MG PO TABS: 500.0000 mg | ORAL_TABLET | Freq: Two times a day (BID) | ORAL | Status: DC

## 2016-01-10 NOTE — Progress Notes (Signed)
   Subjective:    Patient ID: Logan Young, male    DOB: 1981-10-02, 35 y.o.   MRN: [id number]  Back Pain This is a recurrent problem. The current episode started in the past 7 days. The problem occurs constantly. The problem has been waxing and waning since onset. The pain is present in the lumbar spine. The quality of the pain is described as aching. The pain radiates to the right thigh. The pain is at a severity of 8/10. The pain is moderate. The symptoms are aggravated by sitting. Associated symptoms include leg pain. Pertinent negatives include no bladder incontinence, bowel incontinence, numbness, tingling or weakness. He has tried bed rest and NSAIDs for the symptoms. The treatment provided mild relief.      Review of Systems  Constitutional: Negative.   HENT: Negative.   Respiratory: Negative.   Cardiovascular: Negative.   Gastrointestinal: Negative.  Negative for bowel incontinence.  Endocrine: Negative.   Genitourinary: Negative.  Negative for bladder incontinence.  Musculoskeletal: Positive for back pain.  Neurological: Negative.  Negative for tingling, weakness and numbness.  Hematological: Negative.   Psychiatric/Behavioral: Negative.   All other systems reviewed and are negative.      Objective:   Physical Exam  Constitutional: He is oriented to person, place, and time. He appears well-developed and well-nourished. No distress.  HENT:  Head: Normocephalic.  Eyes: Pupils are equal, round, and reactive to light. Right eye exhibits no discharge. Left eye exhibits no discharge.  Neck: Normal range of motion. Neck supple. No thyromegaly present.  Cardiovascular: Normal rate, regular rhythm, normal heart sounds and intact distal pulses.   No murmur heard. Pulmonary/Chest: Effort normal and breath sounds normal. No respiratory distress. He has no wheezes.  Abdominal: Soft. Bowel sounds are normal. He exhibits no distension. There is no tenderness.  Musculoskeletal: Normal  range of motion. He exhibits no edema or tenderness.  Neurological: He is alert and oriented to person, place, and time. He has normal reflexes. No cranial nerve deficit.  Skin: Skin is warm and dry. No rash noted. No erythema.  Psychiatric: He has a normal mood and affect. His behavior is normal. Judgment and thought content normal.  Vitals reviewed.   BP 122/75 mmHg  Pulse 74  Temp(Src) 97.2 F (36.2 C) (Oral)  Ht 6' (1.829 m)  Wt 210 lb (95.255 kg)  BMI 28.47 kg/m2        Assessment & Plan:  1. Midline low back pain with right-sided sciatica -Rest -Ice and heat as needed for pain -ROM exercises discussed -Sedation precautions discussed with flexeril -RTO prn  - naproxen (NAPROSYN) 500 MG tablet; Take 1 tablet (500 mg total) by mouth 2 (two) times daily with a meal.  Dispense: 60 tablet; Refill: 1 - cyclobenzaprine (FLEXERIL) 10 MG tablet; Take 1 tablet (10 mg total) by mouth 3 (three) times daily as needed for muscle spasms.  Dispense: 30 tablet; Refill: 0  Jannifer Rodneyhristy Jevante Hollibaugh, FNP

## 2016-01-10 NOTE — Patient Instructions (Signed)
Sciatica With Rehab The sciatic nerve runs from the back down the leg and is responsible for sensation and control of the muscles in the back (posterior) side of the thigh, lower leg, and foot. Sciatica is a condition that is characterized by inflammation of this nerve.  SYMPTOMS   Signs of nerve damage, including numbness and/or weakness along the posterior side of the lower extremity.  Pain in the back of the thigh that may also travel down the leg.  Pain that worsens when sitting for long periods of time.  Occasionally, pain in the back or buttock. CAUSES  Inflammation of the sciatic nerve is the cause of sciatica. The inflammation is due to something irritating the nerve. Common sources of irritation include:  Sitting for long periods of time.  Direct trauma to the nerve.  Arthritis of the spine.  Herniated or ruptured disk.  Slipping of the vertebrae (spondylolisthesis).  Pressure from soft tissues, such as muscles or ligament-like tissue (fascia). RISK INCREASES WITH:  Sports that place pressure or stress on the spine (football or weightlifting).  Poor strength and flexibility.  Failure to warm up properly before activity.  Family history of low back pain or disk disorders.  Previous back injury or surgery.  Poor body mechanics, especially when lifting, or poor posture. PREVENTION   Warm up and stretch properly before activity.  Maintain physical fitness:  Strength, flexibility, and endurance.  Cardiovascular fitness.  Learn and use proper technique, especially with posture and lifting. When possible, have coach correct improper technique.  Avoid activities that place stress on the spine. PROGNOSIS If treated properly, then sciatica usually resolves within 6 weeks. However, occasionally surgery is necessary.  RELATED COMPLICATIONS   Permanent nerve damage, including pain, numbness, tingle, or weakness.  Chronic back pain.  Risks of surgery: infection,  bleeding, nerve damage, or damage to surrounding tissues. TREATMENT Treatment initially involves resting from any activities that aggravate your symptoms. The use of ice and medication may help reduce pain and inflammation. The use of strengthening and stretching exercises may help reduce pain with activity. These exercises may be performed at home or with referral to a therapist. A therapist may recommend further treatments, such as transcutaneous electronic nerve stimulation (TENS) or ultrasound. Your caregiver may recommend corticosteroid injections to help reduce inflammation of the sciatic nerve. If symptoms persist despite non-surgical (conservative) treatment, then surgery may be recommended. MEDICATION  If pain medication is necessary, then nonsteroidal anti-inflammatory medications, such as aspirin and ibuprofen, or other minor pain relievers, such as acetaminophen, are often recommended.  Do not take pain medication for 7 days before surgery.  Prescription pain relievers may be given if deemed necessary by your caregiver. Use only as directed and only as much as you need.  Ointments applied to the skin may be helpful.  Corticosteroid injections may be given by your caregiver. These injections should be reserved for the most serious cases, because they may only be given a certain number of times. HEAT AND COLD  Cold treatment (icing) relieves pain and reduces inflammation. Cold treatment should be applied for 10 to 15 minutes every 2 to 3 hours for inflammation and pain and immediately after any activity that aggravates your symptoms. Use ice packs or massage the area with a piece of ice (ice massage).  Heat treatment may be used prior to performing the stretching and strengthening activities prescribed by your caregiver, physical therapist, or athletic trainer. Use a heat pack or soak the injury in warm water.   SEEK MEDICAL CARE IF:  Treatment seems to offer no benefit, or the condition  worsens.  Any medications produce adverse side effects. EXERCISES  RANGE OF MOTION (ROM) AND STRETCHING EXERCISES - Sciatica Most people with sciatic will find that their symptoms worsen with either excessive bending forward (flexion) or arching at the low back (extension). The exercises which will help resolve your symptoms will focus on the opposite motion. Your physician, physical therapist or athletic trainer will help you determine which exercises will be most helpful to resolve your low back pain. Do not complete any exercises without first consulting with your clinician. Discontinue any exercises which worsen your symptoms until you speak to your clinician. If you have pain, numbness or tingling which travels down into your buttocks, leg or foot, the goal of the therapy is for these symptoms to move closer to your back and eventually resolve. Occasionally, these leg symptoms will get better, but your low back pain may worsen; this is typically an indication of progress in your rehabilitation. Be certain to be very alert to any changes in your symptoms and the activities in which you participated in the 24 hours prior to the change. Sharing this information with your clinician will allow him/her to most efficiently treat your condition. These exercises may help you when beginning to rehabilitate your injury. Your symptoms may resolve with or without further involvement from your physician, physical therapist or athletic trainer. While completing these exercises, remember:   Restoring tissue flexibility helps normal motion to return to the joints. This allows healthier, less painful movement and activity.  An effective stretch should be held for at least 30 seconds.  A stretch should never be painful. You should only feel a gentle lengthening or release in the stretched tissue. FLEXION RANGE OF MOTION AND STRETCHING EXERCISES: STRETCH - Flexion, Single Knee to Chest   Lie on a firm bed or floor  with both legs extended in front of you.  Keeping one leg in contact with the floor, bring your opposite knee to your chest. Hold your leg in place by either grabbing behind your thigh or at your knee.  Pull until you feel a gentle stretch in your low back. Hold __________ seconds.  Slowly release your grasp and repeat the exercise with the opposite side. Repeat __________ times. Complete this exercise __________ times per day.  STRETCH - Flexion, Double Knee to Chest  Lie on a firm bed or floor with both legs extended in front of you.  Keeping one leg in contact with the floor, bring your opposite knee to your chest.  Tense your stomach muscles to support your back and then lift your other knee to your chest. Hold your legs in place by either grabbing behind your thighs or at your knees.  Pull both knees toward your chest until you feel a gentle stretch in your low back. Hold __________ seconds.  Tense your stomach muscles and slowly return one leg at a time to the floor. Repeat __________ times. Complete this exercise __________ times per day.  STRETCH - Low Trunk Rotation   Lie on a firm bed or floor. Keeping your legs in front of you, bend your knees so they are both pointed toward the ceiling and your feet are flat on the floor.  Extend your arms out to the side. This will stabilize your upper body by keeping your shoulders in contact with the floor.  Gently and slowly drop both knees together to one side until   you feel a gentle stretch in your low back. Hold for __________ seconds.  Tense your stomach muscles to support your low back as you bring your knees back to the starting position. Repeat the exercise to the other side. Repeat __________ times. Complete this exercise __________ times per day  EXTENSION RANGE OF MOTION AND FLEXIBILITY EXERCISES: STRETCH - Extension, Prone on Elbows  Lie on your stomach on the floor, a bed will be too soft. Place your palms about shoulder  width apart and at the height of your head.  Place your elbows under your shoulders. If this is too painful, stack pillows under your chest.  Allow your body to relax so that your hips drop lower and make contact more completely with the floor.  Hold this position for __________ seconds.  Slowly return to lying flat on the floor. Repeat __________ times. Complete this exercise __________ times per day.  RANGE OF MOTION - Extension, Prone Press Ups  Lie on your stomach on the floor, a bed will be too soft. Place your palms about shoulder width apart and at the height of your head.  Keeping your back as relaxed as possible, slowly straighten your elbows while keeping your hips on the floor. You may adjust the placement of your hands to maximize your comfort. As you gain motion, your hands will come more underneath your shoulders.  Hold this position __________ seconds.  Slowly return to lying flat on the floor. Repeat __________ times. Complete this exercise __________ times per day.  STRENGTHENING EXERCISES - Sciatica  These exercises may help you when beginning to rehabilitate your injury. These exercises should be done near your "sweet spot." This is the neutral, low-back arch, somewhere between fully rounded and fully arched, that is your least painful position. When performed in this safe range of motion, these exercises can be used for people who have either a flexion or extension based injury. These exercises may resolve your symptoms with or without further involvement from your physician, physical therapist or athletic trainer. While completing these exercises, remember:   Muscles can gain both the endurance and the strength needed for everyday activities through controlled exercises.  Complete these exercises as instructed by your physician, physical therapist or athletic trainer. Progress with the resistance and repetition exercises only as your caregiver advises.  You may  experience muscle soreness or fatigue, but the pain or discomfort you are trying to eliminate should never worsen during these exercises. If this pain does worsen, stop and make certain you are following the directions exactly. If the pain is still present after adjustments, discontinue the exercise until you can discuss the trouble with your clinician. STRENGTHENING - Deep Abdominals, Pelvic Tilt   Lie on a firm bed or floor. Keeping your legs in front of you, bend your knees so they are both pointed toward the ceiling and your feet are flat on the floor.  Tense your lower abdominal muscles to press your low back into the floor. This motion will rotate your pelvis so that your tail bone is scooping upwards rather than pointing at your feet or into the floor.  With a gentle tension and even breathing, hold this position for __________ seconds. Repeat __________ times. Complete this exercise __________ times per day.  STRENGTHENING - Abdominals, Crunches   Lie on a firm bed or floor. Keeping your legs in front of you, bend your knees so they are both pointed toward the ceiling and your feet are flat on the   floor. Cross your arms over your chest.  Slightly tip your chin down without bending your neck.  Tense your abdominals and slowly lift your trunk high enough to just clear your shoulder blades. Lifting higher can put excessive stress on the low back and does not further strengthen your abdominal muscles.  Control your return to the starting position. Repeat __________ times. Complete this exercise __________ times per day.  STRENGTHENING - Quadruped, Opposite UE/LE Lift  Assume a hands and knees position on a firm surface. Keep your hands under your shoulders and your knees under your hips. You may place padding under your knees for comfort.  Find your neutral spine and gently tense your abdominal muscles so that you can maintain this position. Your shoulders and hips should form a rectangle  that is parallel with the floor and is not twisted.  Keeping your trunk steady, lift your right hand no higher than your shoulder and then your left leg no higher than your hip. Make sure you are not holding your breath. Hold this position __________ seconds.  Continuing to keep your abdominal muscles tense and your back steady, slowly return to your starting position. Repeat with the opposite arm and leg. Repeat __________ times. Complete this exercise __________ times per day.  STRENGTHENING - Abdominals and Quadriceps, Straight Leg Raise   Lie on a firm bed or floor with both legs extended in front of you.  Keeping one leg in contact with the floor, bend the other knee so that your foot can rest flat on the floor.  Find your neutral spine, and tense your abdominal muscles to maintain your spinal position throughout the exercise.  Slowly lift your straight leg off the floor about 6 inches for a count of 15, making sure to not hold your breath.  Still keeping your neutral spine, slowly lower your leg all the way to the floor. Repeat this exercise with each leg __________ times. Complete this exercise __________ times per day. POSTURE AND BODY MECHANICS CONSIDERATIONS - Sciatica Keeping correct posture when sitting, standing or completing your activities will reduce the stress put on different body tissues, allowing injured tissues a chance to heal and limiting painful experiences. The following are general guidelines for improved posture. Your physician or physical therapist will provide you with any instructions specific to your needs. While reading these guidelines, remember:  The exercises prescribed by your provider will help you have the flexibility and strength to maintain correct postures.  The correct posture provides the optimal environment for your joints to work. All of your joints have less wear and tear when properly supported by a spine with good posture. This means you will  experience a healthier, less painful body.  Correct posture must be practiced with all of your activities, especially prolonged sitting and standing. Correct posture is as important when doing repetitive low-stress activities (typing) as it is when doing a single heavy-load activity (lifting). RESTING POSITIONS Consider which positions are most painful for you when choosing a resting position. If you have pain with flexion-based activities (sitting, bending, stooping, squatting), choose a position that allows you to rest in a less flexed posture. You would want to avoid curling into a fetal position on your side. If your pain worsens with extension-based activities (prolonged standing, working overhead), avoid resting in an extended position such as sleeping on your stomach. Most people will find more comfort when they rest with their spine in a more neutral position, neither too rounded nor too   arched. Lying on a non-sagging bed on your side with a pillow between your knees, or on your back with a pillow under your knees will often provide some relief. Keep in mind, being in any one position for a prolonged period of time, no matter how correct your posture, can still lead to stiffness. PROPER SITTING POSTURE In order to minimize stress and discomfort on your spine, you must sit with correct posture Sitting with good posture should be effortless for a healthy body. Returning to good posture is a gradual process. Many people can work toward this most comfortably by using various supports until they have the flexibility and strength to maintain this posture on their own. When sitting with proper posture, your ears will fall over your shoulders and your shoulders will fall over your hips. You should use the back of the chair to support your upper back. Your low back will be in a neutral position, just slightly arched. You may place a small pillow or folded towel at the base of your low back for support.  When  working at a desk, create an environment that supports good, upright posture. Without extra support, muscles fatigue and lead to excessive strain on joints and other tissues. Keep these recommendations in mind: CHAIR:   A chair should be able to slide under your desk when your back makes contact with the back of the chair. This allows you to work closely.  The chair's height should allow your eyes to be level with the upper part of your monitor and your hands to be slightly lower than your elbows. BODY POSITION  Your feet should make contact with the floor. If this is not possible, use a foot rest.  Keep your ears over your shoulders. This will reduce stress on your neck and low back. INCORRECT SITTING POSTURES   If you are feeling tired and unable to assume a healthy sitting posture, do not slouch or slump. This puts excessive strain on your back tissues, causing more damage and pain. Healthier options include:  Using more support, like a lumbar pillow.  Switching tasks to something that requires you to be upright or walking.  Talking a brief walk.  Lying down to rest in a neutral-spine position. PROLONGED STANDING WHILE SLIGHTLY LEANING FORWARD  When completing a task that requires you to lean forward while standing in one place for a long time, place either foot up on a stationary 2-4 inch high object to help maintain the best posture. When both feet are on the ground, the low back tends to lose its slight inward curve. If this curve flattens (or becomes too large), then the back and your other joints will experience too much stress, fatigue more quickly and can cause pain.  CORRECT STANDING POSTURES Proper standing posture should be assumed with all daily activities, even if they only take a few moments, like when brushing your teeth. As in sitting, your ears should fall over your shoulders and your shoulders should fall over your hips. You should keep a slight tension in your abdominal  muscles to brace your spine. Your tailbone should point down to the ground, not behind your body, resulting in an over-extended swayback posture.  INCORRECT STANDING POSTURES  Common incorrect standing postures include a forward head, locked knees and/or an excessive swayback. WALKING Walk with an upright posture. Your ears, shoulders and hips should all line-up. PROLONGED ACTIVITY IN A FLEXED POSITION When completing a task that requires you to bend forward   at your waist or lean over a low surface, try to find a way to stabilize 3 of 4 of your limbs. You can place a hand or elbow on your thigh or rest a knee on the surface you are reaching across. This will provide you more stability so that your muscles do not fatigue as quickly. By keeping your knees relaxed, or slightly bent, you will also reduce stress across your low back. CORRECT LIFTING TECHNIQUES DO :   Assume a wide stance. This will provide you more stability and the opportunity to get as close as possible to the object which you are lifting.  Tense your abdominals to brace your spine; then bend at the knees and hips. Keeping your back locked in a neutral-spine position, lift using your leg muscles. Lift with your legs, keeping your back straight.  Test the weight of unknown objects before attempting to lift them.  Try to keep your elbows locked down at your sides in order get the best strength from your shoulders when carrying an object.  Always ask for help when lifting heavy or awkward objects. INCORRECT LIFTING TECHNIQUES DO NOT:   Lock your knees when lifting, even if it is a small object.  Bend and twist. Pivot at your feet or move your feet when needing to change directions.  Assume that you cannot safely pick up a paperclip without proper posture.   This information is not intended to replace advice given to you by your health care provider. Make sure you discuss any questions you have with your health care provider.     Document Released: 10/05/2005 Document Revised: 02/19/2015 Document Reviewed: 01/17/2009 [employer] Interactive Patient Education 2016 [employer]  

## 2016-01-28 ENCOUNTER — Other Ambulatory Visit: Payer: Self-pay | Admitting: Family

## 2016-01-28 ENCOUNTER — Telehealth: Payer: Self-pay | Admitting: *Deleted

## 2016-01-28 NOTE — Telephone Encounter (Signed)
Per Logan Young patient needs an appointment to complete short term disability forms

## 2016-02-03 NOTE — Telephone Encounter (Signed)
Patient has appointment for 4/18

## 2016-02-04 ENCOUNTER — Ambulatory Visit: Payer: BLUE CROSS/BLUE SHIELD | Admitting: Family Medicine

## 2016-02-05 ENCOUNTER — Encounter: Payer: Self-pay | Admitting: Family Medicine

## 2016-02-17 ENCOUNTER — Other Ambulatory Visit: Payer: Self-pay | Admitting: Family

## 2016-08-24 ENCOUNTER — Ambulatory Visit: Payer: BLUE CROSS/BLUE SHIELD | Attending: Urology | Admitting: Physical Therapy

## 2016-09-02 ENCOUNTER — Telehealth: Payer: Self-pay | Admitting: Family Medicine

## 2016-09-02 ENCOUNTER — Encounter: Payer: Self-pay | Admitting: Family Medicine

## 2016-09-02 ENCOUNTER — Ambulatory Visit (INDEPENDENT_AMBULATORY_CARE_PROVIDER_SITE_OTHER): Payer: BLUE CROSS/BLUE SHIELD | Admitting: Family Medicine

## 2016-09-02 ENCOUNTER — Ambulatory Visit: Payer: Self-pay | Admitting: Family Medicine

## 2016-09-02 VITALS — BP 114/79 | HR 75 | Temp 97.8°F | Ht 72.0 in | Wt 209.0 lb

## 2016-09-02 DIAGNOSIS — G8929 Other chronic pain: Secondary | ICD-10-CM

## 2016-09-02 DIAGNOSIS — M545 Low back pain: Secondary | ICD-10-CM

## 2016-09-02 NOTE — Addendum Note (Signed)
Addended by: Fawn KirkHOLT, CATHY on: 09/02/2016 04:30 PM   Modules accepted: Orders

## 2016-09-02 NOTE — Progress Notes (Signed)
   Subjective:    Patient ID: Logan MattersRobert Young, male    DOB: 1981-09-04, 35 y.o.   MRN: [id number]  HPI 35 year old gentleman who had been lost to follow-up for some time. He had been seen here for back pain that was originally thought to be work related but then denied because of prior accident and injury to his back. He has been receiving injections in his back at pain management clinic. Some have helped some of not but he tells me today he feels like he can work and needs to go back to work to pay his bills. I reviewed his last MRI that was done at [facility]. Compared to the previous study one year ago there was some changes and worsening of disc disease. He has not seen a back or neurosurgeon to see if it is felt that surgery might fix the problem.  Patient Active Problem List   Diagnosis Date Noted  . Left-sided low back pain without sciatica 06/12/2015  . Depression   . Migraines   . Insomnia   . Chronic low back pain    Outpatient Encounter Prescriptions as of 09/02/2016  Medication Sig  . cyclobenzaprine (FLEXERIL) 10 MG tablet Take 1 tablet (10 mg total) by mouth 3 (three) times daily as needed for muscle spasms. (Patient not taking: Reported on 09/02/2016)  . naproxen (NAPROSYN) 500 MG tablet Take 1 tablet (500 mg total) by mouth 2 (two) times daily with a meal. (Patient not taking: Reported on 09/02/2016)   No facility-administered encounter medications on file as of 09/02/2016.       Review of Systems  Constitutional: Negative.   HENT: Negative.   Respiratory: Negative.   Musculoskeletal: Positive for back pain.  Neurological: Negative.        Objective:   Physical Exam  Constitutional: He is oriented to person, place, and time. He appears well-developed and well-nourished.  Musculoskeletal: Normal range of motion.  Neurological: He is alert and oriented to person, place, and time. He has normal reflexes.  Psychiatric: He has a normal mood and affect. His behavior  is normal.   BP 114/79   Pulse 75   Temp 97.8 F (36.6 C) (Oral)   Ht 6' (1.829 m)   Wt 209 lb (94.8 kg)   BMI 28.35 kg/m         Assessment & Plan:  1. Chronic bilateral low back pain without sciatica Chronic back pain. Patient desires to go back to work. I do believe in reviewing MRIs that he felt to have a second opinion from a back specialist and will arrange for him to see Dr. [facility]. In the meantime we'll let him go back to work with instructions to avoid activities which aggravate symptoms.  Frederica KusterStephen M Miller MD

## 2016-09-02 NOTE — Telephone Encounter (Signed)
Appointment given today with Hyacinth MeekerMiller.

## 2016-10-21 ENCOUNTER — Telehealth: Payer: Self-pay | Admitting: Family Medicine

## 2016-10-22 NOTE — Telephone Encounter (Signed)
Note faxed.

## 2016-11-09 ENCOUNTER — Telehealth: Payer: Self-pay | Admitting: Family Medicine

## 2016-11-11 NOTE — Telephone Encounter (Signed)
Called patient and told him his claim was faxed 09-07-2016 with the claim # on it.

## 2017-03-24 ENCOUNTER — Encounter: Payer: Self-pay | Admitting: Family Medicine

## 2017-03-24 ENCOUNTER — Ambulatory Visit (INDEPENDENT_AMBULATORY_CARE_PROVIDER_SITE_OTHER): Payer: BLUE CROSS/BLUE SHIELD | Admitting: Family Medicine

## 2017-03-24 VITALS — BP 139/90 | HR 97 | Temp 98.8°F | Ht 73.0 in | Wt 218.0 lb

## 2017-03-24 DIAGNOSIS — M25511 Pain in right shoulder: Secondary | ICD-10-CM | POA: Diagnosis not present

## 2017-03-24 DIAGNOSIS — S43004D Unspecified dislocation of right shoulder joint, subsequent encounter: Secondary | ICD-10-CM

## 2017-03-24 DIAGNOSIS — S43004A Unspecified dislocation of right shoulder joint, initial encounter: Secondary | ICD-10-CM

## 2017-03-24 NOTE — Progress Notes (Signed)
   HPI  Patient presents today here with right shoulder pain.  Patient was recently seen in the emergency room, proximal one week ago, after falling and causing a right knee abrasion and dislocating his right shoulder. His shoulder was relocated in the hospital. Since that time he's had anterior right shoulder pain with activity. 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  Patient is currently working for a [employer] process after being injured at work involving his finger neck. He has set up for those problems.  Patient also complains of fatigue, he would like to come back and discuss that at a physical exam because he would like complete blood work.  PMH: Smoking status noted ROS: Per HPI  Objective: BP 139/90   Pulse 97   Temp 98.8 F (37.1 C) (Oral)   Ht 6\' 1"  (1.854 m)   Wt 218 lb (98.9 kg)   BMI 28.76 kg/m  Gen: NAD, alert, cooperative with exam HEENT: NCAT CV: RRR, good S1/S2, no murmur Resp: CTABL, no wheezes, non-labored Ext: No edema, warm Neuro: Alert and oriented, No gross deficits  MSK: Limited range of motion of the right shoulder, only able to flex 490 and abduct 90 without pain. Tenderness to palpation along the anterior joint capsule  Assessment and plan:  # Right shoulder pain after right shoulder dislocation Persistent pain after dislocation, discussed this could be appropriate amount of pain versus deeper injury from the dislocation. Referral with the surgery for their opinion. Discussed NSAIDs as needed.  Pt will will return for complete physical exam.    Orders Placed This Encounter  Procedures  . Ambulatory referral to Orthopedic Surgery    Referral Priority:   Routine    Referral Type:   Surgical    Referral Reason:   Specialty Services Required    Requested Specialty:   Orthopedic Surgery    Number of Visits Requested:   1     Murtis SinkSam Craigory Toste, MD [facility] 03/24/2017, 4:28  PM

## 2017-03-24 NOTE — Patient Instructions (Signed)
Great to meet you!  Come back when you would like to for a physical, just schedule the appointment. We will be glad to check all of your labs.   We will work on a referral to orthopedic surgery.

## 2017-03-31 DIAGNOSIS — Z0289 Encounter for other administrative examinations: Secondary | ICD-10-CM

## 2017-04-02 ENCOUNTER — Encounter: Payer: BLUE CROSS/BLUE SHIELD | Admitting: Family Medicine

## 2017-04-05 ENCOUNTER — Encounter: Payer: Self-pay | Admitting: Family Medicine

## 2017-06-11 ENCOUNTER — Ambulatory Visit: Payer: BLUE CROSS/BLUE SHIELD | Admitting: Family Medicine

## 2017-06-14 ENCOUNTER — Telehealth: Payer: Self-pay | Admitting: Family Medicine

## 2017-06-14 ENCOUNTER — Encounter: Payer: Self-pay | Admitting: Family Medicine

## 2017-06-14 NOTE — Telephone Encounter (Signed)
LMTCB to reschedule missed apt.

## 2017-07-22 ENCOUNTER — Telehealth: Payer: Self-pay | Admitting: Family Medicine

## 2017-07-22 DIAGNOSIS — K529 Noninfective gastroenteritis and colitis, unspecified: Secondary | ICD-10-CM | POA: Insufficient documentation

## 2017-07-22 NOTE — Telephone Encounter (Signed)
What type of referral do you need? To Dr. Willeen Cass in [place], [place] at the [facility] office. GI doctor  Have you been seen at our office for this problem? YES (If no, schedule them an appointment.  They will need to be seen before a referral can be done.)  Is there a particular doctor or location that you prefer? Dr. Willeen Cass in [place]  Patient notified that referrals can take up to a week or longer to process. If they haven't heard anything within a week they should call back and speak with the referral department.

## 2017-07-22 NOTE — Telephone Encounter (Signed)
Referral placed.   Murtis Sink, MD [facility] 07/22/2017, 2:46 PM

## 2017-07-22 NOTE — Telephone Encounter (Signed)
Pt aware that referral was placed.

## 2017-07-22 NOTE — Telephone Encounter (Signed)
Patient states he has been having trouble with diarrhea and abdominal pain that has been going on for years. He states he had talked to Dr. Hyacinth Meeker about it but never mentioned it to Dr. Ermalinda Memos when switching providers. Has seen Dr. Willeen Cass in [facility] at the [facility]- GI doctor in 2013. Since it has been so long their office told him that he would need a new referral. They tested him from [facility] in 2013 and never found out exactly what was going on. Patient states it is getting worse and is having diarrhea at least 3 times daily. Please advise if you will place referral or if patient has to be seen.

## 2017-08-20 ENCOUNTER — Telehealth: Payer: Self-pay

## 2017-08-20 NOTE — Telephone Encounter (Signed)
Wants a referral for Dr [place] 

## 2017-08-20 NOTE — Telephone Encounter (Signed)
lmtcb

## 2017-08-20 NOTE — Telephone Encounter (Signed)
I need an indication. We typically shpoulld have an appt so I can evaluate and explain why Im sending him.   Logan [name], MD [facility] 08/20/2017, 12:08 PM

## 2017-08-24 ENCOUNTER — Telehealth: Payer: Self-pay | Admitting: Family Medicine

## 2017-08-24 NOTE — Telephone Encounter (Signed)
Spoke with pt. He wants to be seen for pain management. Pt has not been seen in our office since June so he was instructed he would need an  Office visit before referral could be placed. Pt was offered to schedule an appointment but declined until he can check his schedule. Pt states he will call us back to schedule

## 2017-11-10 ENCOUNTER — Telehealth: Payer: Self-pay | Admitting: Family Medicine

## 2017-11-10 DIAGNOSIS — M545 Low back pain: Principal | ICD-10-CM

## 2017-11-10 DIAGNOSIS — G8929 Other chronic pain: Secondary | ICD-10-CM

## 2017-11-10 NOTE — Telephone Encounter (Signed)
[facility] Please advise

## 2017-11-18 NOTE — Telephone Encounter (Signed)
Pt aware of referral.

## 2017-11-18 NOTE — Telephone Encounter (Signed)
Referral placed.   Murtis [name], MD [facility] 11/18/2017, 10:25 AM

## 2018-03-28 ENCOUNTER — Other Ambulatory Visit: Payer: Self-pay | Admitting: Neurology

## 2018-03-28 DIAGNOSIS — R449 Unspecified symptoms and signs involving general sensations and perceptions: Secondary | ICD-10-CM

## 2018-05-31 ENCOUNTER — Ambulatory Visit (HOSPITAL_COMMUNITY): Payer: BLUE CROSS/BLUE SHIELD

## 2018-11-04 ENCOUNTER — Ambulatory Visit: Payer: BLUE CROSS/BLUE SHIELD | Admitting: Family Medicine

## 2018-11-08 ENCOUNTER — Ambulatory Visit: Payer: BLUE CROSS/BLUE SHIELD | Admitting: Family Medicine

## 2018-11-10 ENCOUNTER — Encounter: Payer: Self-pay | Admitting: Family Medicine

## 2018-11-16 ENCOUNTER — Ambulatory Visit: Payer: BLUE CROSS/BLUE SHIELD | Admitting: Family Medicine

## 2018-11-16 ENCOUNTER — Encounter: Payer: Self-pay | Admitting: Family Medicine

## 2018-11-16 VITALS — BP 131/86 | HR 85 | Temp 97.8°F | Ht 73.0 in | Wt 194.0 lb

## 2018-11-16 DIAGNOSIS — G8929 Other chronic pain: Secondary | ICD-10-CM | POA: Diagnosis not present

## 2018-11-16 DIAGNOSIS — M545 Low back pain: Secondary | ICD-10-CM | POA: Diagnosis not present

## 2018-11-16 DIAGNOSIS — K130 Diseases of lips: Secondary | ICD-10-CM

## 2018-11-16 DIAGNOSIS — Z029 Encounter for administrative examinations, unspecified: Secondary | ICD-10-CM

## 2018-11-16 DIAGNOSIS — B001 Herpesviral vesicular dermatitis: Secondary | ICD-10-CM | POA: Diagnosis not present
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
## 2018-11-16 NOTE — Patient Instructions (Signed)
We will take care of the FMLA forms.  We talked about things to do to help with the spot on your lower lip.  I think that the cracked area is what is causing the swelling in your lip.  I want to start using Abreva and a Vaseline-based product for moisturization.  Avoid use of Chapstick, this tends to dry lips out more.  If symptoms do not improve, contact me and we can consider alternative therapies but this should take care of the issue.

## 2019-01-03 DIAGNOSIS — M542 Cervicalgia: Secondary | ICD-10-CM | POA: Diagnosis not present

## 2019-01-03 DIAGNOSIS — G894 Chronic pain syndrome: Secondary | ICD-10-CM | POA: Diagnosis not present

## 2019-01-03 DIAGNOSIS — G603 Idiopathic progressive neuropathy: Secondary | ICD-10-CM | POA: Diagnosis not present

## 2019-01-03 DIAGNOSIS — M545 Low back pain: Secondary | ICD-10-CM | POA: Diagnosis not present

## 2019-01-16 ENCOUNTER — Telehealth: Payer: Self-pay | Admitting: Family Medicine

## 2019-01-16 NOTE — Telephone Encounter (Signed)
Pt had BP reading of 205/158 6 days ago and pt was dizzy and staggering, another reading later that day after laying down was 168/100 and he is having worsening headaches. He was given new medication at Neurology for migraines, Aimovig 70mg  once monthly injection. First injection was 7 days ago and that is when the headaches became worse and his BP became elevated. Pt states he has never had problems with his BP. Pt states yesterday he felt fine so she didn't check BP. Reading while on the phone with me was 124/82 and heart rate is 91 while sleeping. They called [place] office and was told to call PCP. Please advise.

## 2019-01-16 NOTE — Telephone Encounter (Signed)
If he is having stroke like symptoms, PARTICULARLY in the setting of elevated blood pressures he needs to go to the emergency department.  Pain can certainly cause an elevation in BP.  His BP sounds controlled now but if he is continuing to have dizziness and staggering he needs to have emergent head scan to rule out stroke.

## 2019-01-16 NOTE — Telephone Encounter (Signed)
Pt's significant other aware of MD feedback and voiced understanding. She will continue to monitor BP readings and call back if he develops any dizziness, staggering again.

## 2019-01-31 ENCOUNTER — Other Ambulatory Visit: Payer: Self-pay

## 2019-01-31 ENCOUNTER — Encounter: Payer: Self-pay | Admitting: Family

## 2019-01-31 ENCOUNTER — Ambulatory Visit (INDEPENDENT_AMBULATORY_CARE_PROVIDER_SITE_OTHER): Payer: BLUE CROSS/BLUE SHIELD | Admitting: Family

## 2019-01-31 DIAGNOSIS — J029 Acute pharyngitis, unspecified: Secondary | ICD-10-CM

## 2019-01-31 DIAGNOSIS — F17200 Nicotine dependence, unspecified, uncomplicated: Secondary | ICD-10-CM | POA: Diagnosis not present

## 2019-01-31 MED ORDER — AMOXICILLIN 875 MG PO TABS: 875.0000 mg | ORAL_TABLET | Freq: Two times a day (BID) | ORAL | 0 refills | Status: DC

## 2019-01-31 NOTE — Progress Notes (Signed)
   Virtual Visit via telephone Note  I connected with Logan Young on 01/31/19 at 1:03 pm by telephone and verified that I am speaking with the correct person using two identifiers. Logan Young is currently located at home and no one is currently with her during visit. The provider, Jannifer Rodney, FNP is located in their office at time of visit.  I discussed the limitations, risks, security and privacy concerns of performing an evaluation and management service by telephone and the availability of in person appointments. I also discussed with the patient that there may be a patient responsible charge related to this service. The patient expressed understanding and agreed to proceed.   History and Present Illness:     Sore Throat   This is a new problem. The current episode started in the past 7 days. The problem has been gradually worsening. The maximum temperature recorded prior to his arrival was 101 - 101.9 F. The pain is at a severity of 4/10. The pain is moderate. Associated symptoms include congestion, coughing, headaches and trouble swallowing. Pertinent negatives include no ear pain, shortness of breath or swollen glands. He has tried acetaminophen for the symptoms. The treatment provided mild relief.  Cough  This is a new problem. The current episode started in the past 7 days. The problem has been gradually worsening. The problem occurs every few minutes. Associated symptoms include headaches. Pertinent negatives include no ear pain or shortness of breath.      Review of Systems  HENT: Positive for congestion and trouble swallowing. Negative for ear pain.   Respiratory: Positive for cough. Negative for shortness of breath.   Neurological: Positive for headaches.  All other systems reviewed and are negative.      Observations/Objective: No SOB or distress noted  Assessment and Plan: 1. Acute pharyngitis, unspecified etiology - Take meds as prescribed - Use a cool mist  humidifier  -Use saline nose sprays frequently -Force fluids -For any cough or congestion  Use plain Mucinex- regular strength or max strength is fine -For fever or aces or pains- take tylenol or ibuprofen. -Throat lozenges if help -New toothbrush in 3 days - amoxicillin (AMOXIL) 875 MG tablet; Take 1 tablet (875 mg total) by mouth 2 (two) times daily.  Dispense: 14 tablet; Refill: 0  2. Current smoker Smoking cessation discussed  Given symptoms of fever and cough (cough is only at night, post nasal?), told to self isolate for 14 days.  I will treat for strep throat  Force fluids   Follow Up Instructions: As needed or if symptoms worsen or do not improve     I discussed the assessment and treatment plan with the patient. The patient was provided an opportunity to ask questions and all were answered. The patient agreed with the plan and demonstrated an understanding of the instructions.   The patient was advised to call back or seek an in-person evaluation if the symptoms worsen or if the condition fails to improve as anticipated.  The above assessment and management plan was discussed with the patient. The patient verbalized understanding of and has agreed to the management plan. Patient is aware to call the clinic if symptoms persist or worsen. Patient is aware when to return to the clinic for a follow-up visit. Patient educated on when it is appropriate to go to the emergency department.    Call ended 1:18 pm, I provided 15 minutes of non-face-to-face time during this encounter.    Jannifer Rodney, FNP

## 2019-07-11 ENCOUNTER — Telehealth: Payer: Self-pay | Admitting: Family Medicine

## 2019-07-11 NOTE — Telephone Encounter (Signed)
Appointment scheduled.  Patient aware. 

## 2019-07-13 ENCOUNTER — Other Ambulatory Visit: Payer: Self-pay

## 2019-07-14 ENCOUNTER — Other Ambulatory Visit: Payer: Self-pay

## 2019-07-14 ENCOUNTER — Ambulatory Visit (INDEPENDENT_AMBULATORY_CARE_PROVIDER_SITE_OTHER): Payer: BC Managed Care – PPO

## 2019-07-14 ENCOUNTER — Ambulatory Visit: Payer: BC Managed Care – PPO | Admitting: Family Medicine

## 2019-07-14 ENCOUNTER — Encounter: Payer: Self-pay | Admitting: Family Medicine

## 2019-07-14 VITALS — BP 130/82 | HR 88 | Temp 97.5°F | Ht 73.0 in | Wt 202.0 lb

## 2019-07-14 DIAGNOSIS — M79641 Pain in right hand: Secondary | ICD-10-CM

## 2019-07-14 NOTE — Progress Notes (Signed)
Subjective: CC: Hand pain PCP: Raliegh Ip, DO OJJ:[id number] Logan Young is a 38 y.o. male presenting to clinic today for:  1.  Hand pain Patient reports that he started having pain along the back of his right hand below the third digit about 2 weeks ago.  He notes that the pain is intermittent and sharp.  He is right-hand dominant and works quite a bit with his hands.  Pain initially occurred when he was supinating the hand at work and supination tends to aggravate the pain.  Pain is not sustained for more than a couple of seconds.  It resolves with activity modification.  The tramadol, meloxicam, gabapentin and nortriptyline that he is prescribed by his pain specialist do not modify the pain.  Denies any sensation changes, gross swelling or preceding injury.  He has been using Biofreeze and ice in efforts to improve it.   ROS: Per HPI  Allergies  Allergen Reactions  . Sulfa Antibiotics Itching   Past Medical History:  Diagnosis Date  . Chronic low back pain   . Depression   . Insomnia   . Migraines     Current Outpatient Medications:  .  meloxicam (MOBIC) 15 MG tablet, Take 15 mg by mouth daily., Disp: , Rfl:  .  amoxicillin (AMOXIL) 875 MG tablet, Take 1 tablet (875 mg total) by mouth 2 (two) times daily., Disp: 14 tablet, Rfl: 0 .  gabapentin (NEURONTIN) 100 MG capsule, Take 100 mg by mouth 2 (two) times daily., Disp: , Rfl:  .  traMADol (ULTRAM) 50 MG tablet, Take by mouth every 6 (six) hours as needed., Disp: , Rfl:  Social History   Socioeconomic History  . Marital status: Legally Separated    Spouse name: Not on file  . Number of children: Not on file  . Years of education: Not on file  . Highest education level: Not on file  Occupational History  . Not on file  Social Needs  . Financial resource strain: Not on file  . Food insecurity    Worry: Not on file    Inability: Not on file  . Transportation needs    Medical: Not on file    Non-medical: Not on file   Tobacco Use  . Smoking status: Current Every Day Smoker    Packs/day: 1.00    Years: 12.00    Pack years: 12.00  . Smokeless tobacco: Never Used  Substance and Sexual Activity  . Alcohol use: Yes    Comment: rare  . Drug use: No  . Sexual activity: Not on file  Lifestyle  . Physical activity    Days per week: Not on file    Minutes per session: Not on file  . Stress: Not on file  Relationships  . Social Musician on phone: Not on file    Gets together: Not on file    Attends religious service: Not on file    Active member of club or organization: Not on file    Attends meetings of clubs or organizations: Not on file    Relationship status: Not on file  . Intimate partner violence    Fear of current or ex partner: Not on file    Emotionally abused: Not on file    Physically abused: Not on file    Forced sexual activity: Not on file  Other Topics Concern  . Not on file  Social History Narrative  . Not on file   Family History  Problem Relation Age of Onset  . Diabetes Mother   . Cancer Mother        rectal  . Lupus Father   . Cancer Father        stomach    Objective: Office vital signs reviewed. BP 130/82   Pulse 88   Temp (!) 97.5 F (36.4 C) (Temporal)   Ht 6\' 1"  (1.854 m)   Wt 202 lb (91.6 kg)   BMI 26.65 kg/m   Physical Examination:  General: Awake, alert, wel nourished, No acute distress Extremities: warm, well perfused, No edema, cyanosis or clubbing; +2 pulses bilaterally MSK:  Right hand: No gross soft tissue swelling, deformity or color changes.  He has full active range of motion of all fingers.  He has tenderness to palpation along the third metacarpal dorsally.  There are no palpable bony abnormalities or soft tissue findings.  No pain with resisted flexion, extension. Skin: dry; intact; no rashes or lesions Neuro: Sensation grossly intact  Assessment/ Plan: 38 y.o. male   1. Pain of right hand Somewhat confounding.  He has no  pain with resisted strength testing which caused me to think this is less likely to be a tendinitis.  He had bony tenderness on palpation however and therefore I obtained an x-ray of the hand to rule out stress fracture.  I have recommended activity modification, continued ice and continued medications per pain management.  If x-ray is inconclusive, plan for referral to orthopedist in [place] for consideration of evaluation under ultrasound.  Personal review of the x-ray did not demonstrate any significant/obvious findings.  However, there was a slight area of increased shadowing noted at the base of the third metacarpal.  I will await formal review by radiology before proceeding with plan. - DG Hand Complete Right; Future   No orders of the defined types were placed in this encounter.  No orders of the defined types were placed in this encounter.    Janora Norlander, DO [place] [phone]

## 2019-07-14 NOTE — Patient Instructions (Signed)
I am checking you for stress fracture. I do recommend activity modification. Ok to continue current pain regimen.  May need referral to the orthopedist for further evaluation.

## 2019-08-23 ENCOUNTER — Ambulatory Visit (INDEPENDENT_AMBULATORY_CARE_PROVIDER_SITE_OTHER): Payer: BC Managed Care – PPO | Admitting: Family Medicine

## 2019-08-23 ENCOUNTER — Telehealth: Payer: Self-pay | Admitting: Family Medicine

## 2019-08-23 ENCOUNTER — Encounter: Payer: Self-pay | Admitting: Family Medicine

## 2019-08-23 DIAGNOSIS — K589 Irritable bowel syndrome without diarrhea: Secondary | ICD-10-CM | POA: Diagnosis not present

## 2019-08-23 MED ORDER — DICYCLOMINE HCL 20 MG PO TABS: 20.0000 mg | ORAL_TABLET | Freq: Three times a day (TID) | ORAL | 1 refills | Status: DC

## 2019-08-23 NOTE — Telephone Encounter (Signed)
Advised we could not prescribe any pain medications. Is it ok to advise on IBU 600-800mg ?

## 2019-08-23 NOTE — Patient Instructions (Signed)

## 2019-08-23 NOTE — Progress Notes (Signed)
Virtual Visit via Telephone Note  I connected with Logan Young on 08/23/19 at 1:20 PM by telephone and verified that I am speaking with the correct person using two identifiers. Logan Young is currently located at home and nobody is currently with him during this visit. The provider, Gwenlyn Fudge, FNP is located in their home at time of visit.  I discussed the limitations, risks, security and privacy concerns of performing an evaluation and management service by telephone and the availability of in person appointments. I also discussed with the patient that there may be a patient responsible charge related to this service. The patient expressed understanding and agreed to proceed.  Subjective: PCP: Raliegh Ip, DO  Chief Complaint  Patient presents with  . GI Problem   Patient reports stomach pain that occurs with defecation. He also feels bloated a majority of the time. States he is always using the bathroom and feels like he goes back and forth all day long. Stools are soft.  Often feels he has to go 10 minutes after eating. This has been going on for many years. He reports having a colonoscopy 6-7 years ago as his gastroenterologist in [place] thought he might have had Crohn's disease, but his did not reveal any cause for his symptoms. He states in the end he was diagnosed with IBS but has not had medication in a long time.    ROS: Per HPI  Current Outpatient Medications:  .  amoxicillin (AMOXIL) 875 MG tablet, Take 1 tablet (875 mg total) by mouth 2 (two) times daily., Disp: 14 tablet, Rfl: 0 .  gabapentin (NEURONTIN) 100 MG capsule, Take 100 mg by mouth 2 (two) times daily., Disp: , Rfl:  .  meloxicam (MOBIC) 15 MG tablet, Take 15 mg by mouth daily., Disp: , Rfl:  .  traMADol (ULTRAM) 50 MG tablet, Take by mouth every 6 (six) hours as needed., Disp: , Rfl:   Allergies  Allergen Reactions  . Sulfa Antibiotics Itching   Past Medical History:  Diagnosis Date  . Chronic low  back pain   . Depression   . Insomnia   . Migraines     Observations/Objective: A&O  No respiratory distress or wheezing audible over the phone Mood, judgement, and thought processes all WNL  Assessment and Plan: 1. Irritable bowel syndrome, unspecified type - Education provided on IBS. Declined referral to GI at this time; will wait until after f/u with PCP.  - dicyclomine (BENTYL) 20 MG tablet; Take 1 tablet (20 mg total) by mouth 4 (four) times daily -  before meals and at bedtime.  Dispense: 120 tablet; Refill: 1   Follow Up Instructions:  Return 2-3 weeks, for f/u of abdominal pain with PCP.   I discussed the assessment and treatment plan with the patient. The patient was provided an opportunity to ask questions and all were answered. The patient agreed with the plan and demonstrated an understanding of the instructions.   The patient was advised to call back or seek an in-person evaluation if the symptoms worsen or if the condition fails to improve as anticipated.  The above assessment and management plan was discussed with the patient. The patient verbalized understanding of and has agreed to the management plan. Patient is aware to call the clinic if symptoms persist or worsen. Patient is aware when to return to the clinic for a follow-up visit. Patient educated on when it is appropriate to go to the emergency department.   Time  call ended: 1:30 PM  I provided 12 minutes of non-face-to-face time during this encounter.  Hendricks Limes, MSN, APRN, FNP-C [facility] 08/23/19

## 2019-08-23 NOTE — Telephone Encounter (Signed)
Cannot take IBU if on Meloxicam.  Can take Tylenol. Use heat, ice. Muscle rub/ salonpas ok.  Cannot rx pain meds since managed by Dr Merlene Laughter, as this would violate his controlled substance contract.

## 2019-08-23 NOTE — Telephone Encounter (Signed)
Patients girlfriend notified and verbalized understanding.

## 2019-08-29 DIAGNOSIS — G603 Idiopathic progressive neuropathy: Secondary | ICD-10-CM | POA: Diagnosis not present

## 2019-08-29 DIAGNOSIS — M542 Cervicalgia: Secondary | ICD-10-CM | POA: Diagnosis not present

## 2019-08-29 DIAGNOSIS — G894 Chronic pain syndrome: Secondary | ICD-10-CM | POA: Diagnosis not present

## 2019-08-29 DIAGNOSIS — M545 Low back pain: Secondary | ICD-10-CM | POA: Diagnosis not present

## 2019-09-27 DIAGNOSIS — M545 Low back pain: Secondary | ICD-10-CM | POA: Diagnosis not present

## 2019-09-27 DIAGNOSIS — G43019 Migraine without aura, intractable, without status migrainosus: Secondary | ICD-10-CM | POA: Diagnosis not present

## 2019-09-27 DIAGNOSIS — G603 Idiopathic progressive neuropathy: Secondary | ICD-10-CM | POA: Diagnosis not present

## 2019-09-27 DIAGNOSIS — G894 Chronic pain syndrome: Secondary | ICD-10-CM | POA: Diagnosis not present

## 2019-10-24 ENCOUNTER — Other Ambulatory Visit (HOSPITAL_COMMUNITY): Payer: Self-pay | Admitting: Neurology

## 2019-10-24 DIAGNOSIS — M545 Low back pain, unspecified: Secondary | ICD-10-CM

## 2019-11-10 DIAGNOSIS — M545 Low back pain: Secondary | ICD-10-CM | POA: Diagnosis not present

## 2019-11-10 DIAGNOSIS — M5415 Radiculopathy, thoracolumbar region: Secondary | ICD-10-CM | POA: Diagnosis not present

## 2019-12-06 DIAGNOSIS — M545 Low back pain: Secondary | ICD-10-CM | POA: Diagnosis not present

## 2019-12-06 DIAGNOSIS — G43019 Migraine without aura, intractable, without status migrainosus: Secondary | ICD-10-CM | POA: Diagnosis not present

## 2019-12-06 DIAGNOSIS — M542 Cervicalgia: Secondary | ICD-10-CM | POA: Diagnosis not present

## 2019-12-06 DIAGNOSIS — G894 Chronic pain syndrome: Secondary | ICD-10-CM | POA: Diagnosis not present

## 2020-01-09 DIAGNOSIS — G894 Chronic pain syndrome: Secondary | ICD-10-CM | POA: Diagnosis not present

## 2020-01-09 DIAGNOSIS — M545 Low back pain: Secondary | ICD-10-CM | POA: Diagnosis not present

## 2020-01-09 DIAGNOSIS — G603 Idiopathic progressive neuropathy: Secondary | ICD-10-CM | POA: Diagnosis not present

## 2020-02-13 ENCOUNTER — Ambulatory Visit (INDEPENDENT_AMBULATORY_CARE_PROVIDER_SITE_OTHER): Payer: BC Managed Care – PPO | Admitting: Family Medicine

## 2020-02-13 ENCOUNTER — Encounter: Payer: Self-pay | Admitting: Family Medicine

## 2020-02-13 ENCOUNTER — Other Ambulatory Visit: Payer: Self-pay

## 2020-02-13 VITALS — BP 121/84 | HR 86 | Temp 97.9°F | Ht 73.0 in | Wt 209.6 lb

## 2020-02-13 DIAGNOSIS — M545 Low back pain: Secondary | ICD-10-CM

## 2020-02-13 DIAGNOSIS — F329 Major depressive disorder, single episode, unspecified: Secondary | ICD-10-CM | POA: Diagnosis not present

## 2020-02-13 DIAGNOSIS — Z029 Encounter for administrative examinations, unspecified: Secondary | ICD-10-CM

## 2020-02-13 DIAGNOSIS — G8929 Other chronic pain: Secondary | ICD-10-CM | POA: Diagnosis not present
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
## 2020-02-13 NOTE — Patient Instructions (Signed)
MRI has been ordered.  You will be contacted to schedule.  We will plan for spinal surgery referral pending MRI.

## 2020-02-19 ENCOUNTER — Telehealth: Payer: Self-pay | Admitting: Family Medicine

## 2020-03-13 ENCOUNTER — Ambulatory Visit (HOSPITAL_COMMUNITY)
Admission: RE | Admit: 2020-03-13 | Discharge: 2020-03-13 | Disposition: A | Discharge status: Home / Self Care | Payer: BC Managed Care – PPO | Source: Ambulatory Visit | Attending: Family Medicine | Admitting: Family Medicine

## 2020-03-13 ENCOUNTER — Other Ambulatory Visit: Payer: Self-pay

## 2020-03-13 DIAGNOSIS — G8929 Other chronic pain: Secondary | ICD-10-CM

## 2020-03-13 DIAGNOSIS — M545 Low back pain: Secondary | ICD-10-CM | POA: Diagnosis not present

## 2020-03-21 ENCOUNTER — Telehealth: Payer: Self-pay | Admitting: Family Medicine

## 2020-03-21 NOTE — Telephone Encounter (Signed)
States patient already know

## 2020-03-21 NOTE — Telephone Encounter (Signed)
States patient already knows about his MRI resutls.  Would like to know what Dr. Reece Agar thinks he should do ? Wants to know if she thinks he will need surgery? Has upcoming appointment with Dr. Gerilyn Pilgrim on 03/25/20.

## 2020-03-22 ENCOUNTER — Other Ambulatory Visit: Payer: Self-pay | Admitting: Family Medicine

## 2020-03-22 DIAGNOSIS — M5136 Other intervertebral disc degeneration, lumbar region: Secondary | ICD-10-CM

## 2020-03-22 DIAGNOSIS — G8929 Other chronic pain: Secondary | ICD-10-CM

## 2020-03-22 NOTE — Telephone Encounter (Signed)
As indicated in the results note, likely would benefit from seeing a back surgeon to talk about options since symptoms are worse.

## 2020-03-22 NOTE — Telephone Encounter (Signed)
done

## 2020-03-22 NOTE — Telephone Encounter (Signed)
He would like a referral to a Careers adviser.

## 2020-03-22 NOTE — Telephone Encounter (Signed)
Left message to please call our office. 

## 2020-03-25 DIAGNOSIS — M545 Low back pain: Secondary | ICD-10-CM | POA: Diagnosis not present

## 2020-03-25 DIAGNOSIS — Z79891 Long term (current) use of opiate analgesic: Secondary | ICD-10-CM | POA: Diagnosis not present

## 2020-03-25 DIAGNOSIS — G894 Chronic pain syndrome: Secondary | ICD-10-CM | POA: Diagnosis not present

## 2020-03-25 DIAGNOSIS — G603 Idiopathic progressive neuropathy: Secondary | ICD-10-CM | POA: Diagnosis not present

## 2020-04-02 DIAGNOSIS — L709 Acne, unspecified: Secondary | ICD-10-CM | POA: Diagnosis not present

## 2020-04-26 DIAGNOSIS — M5127 Other intervertebral disc displacement, lumbosacral region: Secondary | ICD-10-CM | POA: Diagnosis not present

## 2020-05-08 ENCOUNTER — Other Ambulatory Visit: Payer: Self-pay | Admitting: Neurosurgery

## 2020-05-08 DIAGNOSIS — M5127 Other intervertebral disc displacement, lumbosacral region: Secondary | ICD-10-CM

## 2020-05-23 ENCOUNTER — Other Ambulatory Visit: Payer: BC Managed Care – PPO

## 2020-06-03 ENCOUNTER — Other Ambulatory Visit: Payer: BC Managed Care – PPO

## 2020-06-18 ENCOUNTER — Other Ambulatory Visit: Payer: BC Managed Care – PPO

## 2020-06-26 ENCOUNTER — Telehealth: Payer: Self-pay | Admitting: Family Medicine

## 2020-06-26 DIAGNOSIS — M5136 Other intervertebral disc degeneration, lumbar region: Secondary | ICD-10-CM

## 2020-06-26 DIAGNOSIS — M545 Low back pain, unspecified: Secondary | ICD-10-CM

## 2020-06-26 NOTE — Telephone Encounter (Signed)
Pt has been seen by the neurosurgeon and already has another appt set up with them after his CT scan

## 2020-06-26 NOTE — Telephone Encounter (Signed)
So not sure what patient is wanting.  HE MUST SHOW UP FOR APPTS.  Pain medication UNLIKELY to solve his problem.  He needs to call them back and see if they are willing to see him still.

## 2020-06-26 NOTE — Telephone Encounter (Signed)
Christina-girlfriend calling for pt--she says that his pain DR Renard Hamper is not providing the care he needs when it comes to his pain medications. Dr Renard Hamper has not order a CT scan and back pain is getting worse. He has an old back injury. Girlfriend is asking for a referral to see another pain doctor. Preference [place]. In the meantime, while working on seeing another pain dr, can Nadine Counts prescribe tramadol for patient or a pain medication. I explained I would send message to [facility] but probably not because he was referred for another dr to prescribe the pain med. He may need an apt but Nadine Counts is not available till October. Is it ok to schedule with another provider since pt is asking for pain medication. Please call back.

## 2020-06-26 NOTE — Telephone Encounter (Signed)
Pain meds per current pain doctor.  New referral to pain management placed.  Did patient ever see the neurosurgeon I referred him to in June?

## 2020-07-02 ENCOUNTER — Other Ambulatory Visit: Payer: BC Managed Care – PPO

## 2020-07-09 ENCOUNTER — Telehealth: Payer: Self-pay | Admitting: Family Medicine

## 2020-07-09 NOTE — Telephone Encounter (Signed)
RTC pt has not been seen in our office since 02/13/20, has not been advised to stay out of work, last FMLA was done in April for intermittent FMLA

## 2020-07-15 ENCOUNTER — Encounter: Payer: Self-pay | Admitting: Physical Medicine & Rehabilitation

## 2020-07-15 ENCOUNTER — Ambulatory Visit
Admission: RE | Admit: 2020-07-15 | Discharge: 2020-07-15 | Disposition: A | Discharge status: Home / Self Care | Payer: BC Managed Care – PPO | Source: Ambulatory Visit | Attending: Neurosurgery | Admitting: Neurosurgery

## 2020-07-15 DIAGNOSIS — M5126 Other intervertebral disc displacement, lumbar region: Secondary | ICD-10-CM | POA: Diagnosis not present

## 2020-07-15 DIAGNOSIS — M5137 Other intervertebral disc degeneration, lumbosacral region: Secondary | ICD-10-CM | POA: Diagnosis not present

## 2020-07-15 DIAGNOSIS — M5127 Other intervertebral disc displacement, lumbosacral region: Secondary | ICD-10-CM | POA: Diagnosis not present

## 2020-07-15 DIAGNOSIS — M48061 Spinal stenosis, lumbar region without neurogenic claudication: Secondary | ICD-10-CM | POA: Diagnosis not present

## 2020-07-16 DIAGNOSIS — L709 Acne, unspecified: Secondary | ICD-10-CM | POA: Diagnosis not present

## 2020-08-06 ENCOUNTER — Other Ambulatory Visit: Payer: Self-pay

## 2020-08-06 ENCOUNTER — Encounter: Payer: Self-pay | Admitting: Physical Medicine & Rehabilitation

## 2020-08-06 ENCOUNTER — Encounter
Payer: BC Managed Care – PPO | Attending: Physical Medicine & Rehabilitation | Admitting: Physical Medicine & Rehabilitation

## 2020-08-06 VITALS — BP 134/94 | HR 86 | Temp 98.3°F | Ht 72.0 in | Wt 223.0 lb

## 2020-08-06 DIAGNOSIS — Z79891 Long term (current) use of opiate analgesic: Secondary | ICD-10-CM | POA: Diagnosis not present

## 2020-08-06 DIAGNOSIS — M5440 Lumbago with sciatica, unspecified side: Secondary | ICD-10-CM | POA: Insufficient documentation

## 2020-08-06 DIAGNOSIS — G8929 Other chronic pain: Secondary | ICD-10-CM | POA: Diagnosis not present

## 2020-08-06 DIAGNOSIS — Z5181 Encounter for therapeutic drug level monitoring: Secondary | ICD-10-CM | POA: Insufficient documentation

## 2020-08-06 NOTE — Patient Instructions (Signed)
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
## 2020-08-06 NOTE — Progress Notes (Signed)
Pre procedure reviewed for S1 ESI.  Will need a driver.

## 2020-08-09 LAB — DRUG TOX MONITOR 1 W/CONF, ORAL FLD
Amphetamines: NEGATIVE ng/mL (ref <10)
Barbiturates: NEGATIVE ng/mL (ref <10)
Benzodiazepines: NEGATIVE ng/mL (ref <0.50)
Buprenorphine: NEGATIVE ng/mL (ref <0.10)
Cocaine: NEGATIVE ng/mL (ref <5.0)
Cotinine: 205.5 ng/mL — ABNORMAL HIGH (ref <5.0)
Fentanyl: NEGATIVE ng/mL (ref <0.10)
Heroin Metabolite: NEGATIVE ng/mL (ref <1.0)
MARIJUANA: NEGATIVE ng/mL (ref <2.5)
MDMA: NEGATIVE ng/mL (ref <10)
Meprobamate: NEGATIVE ng/mL (ref <2.5)
Methadone: NEGATIVE ng/mL (ref <5.0)
Nicotine Metabolite: POSITIVE ng/mL — AB (ref <5.0)
Opiates: NEGATIVE ng/mL (ref <2.5)
Phencyclidine: NEGATIVE ng/mL (ref <10)
Tapentadol: NEGATIVE ng/mL (ref <5.0)
Tramadol: >500 ng/mL — ABNORMAL HIGH (ref <5.0)
Tramadol: POSITIVE ng/mL — AB (ref <5.0)
Zolpidem: NEGATIVE ng/mL (ref <5.0)

## 2020-08-09 LAB — DRUG TOX ALC METAB W/CON, ORAL FLD: Alcohol Metabolite: NEGATIVE ng/mL (ref <25)

## 2020-08-12 ENCOUNTER — Telehealth: Payer: Self-pay

## 2020-08-12 MED ORDER — TRAMADOL HCL 50 MG PO TABS: 100.0000 mg | ORAL_TABLET | Freq: Four times a day (QID) | ORAL | 5 refills | Status: DC | PRN

## 2020-08-12 MED ORDER — DULOXETINE HCL 30 MG PO CPEP: 30.0000 mg | ORAL_CAPSULE | Freq: Every day | ORAL | 5 refills | Status: DC

## 2020-08-12 NOTE — Telephone Encounter (Signed)
Per Dr Wynn Banker: "Needs to reduce duloxetine due to high dose tramadol, sent new rx for 30mg  dose duloxetine".  I notified Logan Young of the directions to decrease the duloxetine to 30 mg daily and refilled the tramadol.

## 2020-08-19 ENCOUNTER — Telehealth: Payer: Self-pay | Admitting: *Deleted

## 2020-08-19 NOTE — Telephone Encounter (Signed)
Oral swab drug screen was consistent for prescribed medications.  ?

## 2020-08-23 ENCOUNTER — Encounter: Payer: Self-pay | Admitting: Physical Medicine & Rehabilitation

## 2020-08-23 ENCOUNTER — Encounter
Payer: BC Managed Care – PPO | Attending: Physical Medicine & Rehabilitation | Admitting: Physical Medicine & Rehabilitation

## 2020-08-23 ENCOUNTER — Other Ambulatory Visit: Payer: Self-pay

## 2020-08-23 VITALS — BP 141/88 | HR 92 | Temp 98.6°F | Ht 72.0 in | Wt 216.6 lb

## 2020-08-23 DIAGNOSIS — M5416 Radiculopathy, lumbar region: Secondary | ICD-10-CM | POA: Diagnosis not present

## 2020-08-23 NOTE — Patient Instructions (Addendum)

## 2020-08-23 NOTE — Progress Notes (Signed)
  PROCEDURE RECORD [facility]   Name: Logan Young DOB:[birth year] MRN: [id number]  Date:08/23/2020  Physician: Claudette Laws, MD    Nurse/CMA: Logan Young  Allergies:  Allergies  Allergen Reactions  . Sulfa Antibiotics Itching    Consent Signed: Yes.    Is patient diabetic? No.  CBG today? N/A  Pregnant: No. LMP: No LMP for male patient. (age 39-55)  Anticoagulants: no Anti-inflammatory: no Antibiotics: no  Procedure: Right S1 Epidural steroid injection Position: Prone Start Time: 10:59 End Time:11:02   Fluoro Time: 19  Young/CMA Logan Rys MA Logan Young    Time 10:18 AM 11:05    BP 141/88 129/86    Pulse 92 89    Respirations 16 14    O2 Sat 97 98    S/S 6 6    Pain Level 7/10 3/10     D/C home with Logan Young , patient A & O X 3, D/C instructions reviewed, and sits independently.

## 2020-08-23 NOTE — Progress Notes (Signed)
Lumbar transforaminal epidural steroid injection under fluoroscopic guidance with contrast enhancement  Indication: Lumbosacral radiculitis is not relieved by medication management or other conservative care and interfering with self-care and mobility.   Informed consent was obtained after describing risk and benefits of the procedure with the patient, this includes bleeding, bruising, infection, paralysis and medication side effects.  The patient wishes to proceed and has given written consent.  Patient was placed in prone position.  The lumbar area was marked and prepped with Betadine.  It was entered with a 25-gauge 1-1/2 inch needle and one mL of 1% lidocaine was injected into the skin and subcutaneous tissue.  Then a 22-gauge RIght S1 spinal needle was inserted into the Right S1 foramen intervertebral foramen under AP, lateral, and oblique view.  Once needle tip was within the foramen on lateral views an dnor exceeding 6 o clock position on th epedical on AP viewed Isovue 200 was inected x 37ml Then a solution containing one mL of 10 mg per mL dexamethasone and 2 mL of 1% lidocaine was injected.  The patient tolerated procedure well.  Post procedure instructions were given.  Please see post procedure form.

## 2020-09-20 ENCOUNTER — Encounter: Payer: Self-pay | Admitting: Physical Medicine & Rehabilitation

## 2020-09-20 ENCOUNTER — Encounter
Payer: BC Managed Care – PPO | Attending: Physical Medicine & Rehabilitation | Admitting: Physical Medicine & Rehabilitation

## 2020-09-20 ENCOUNTER — Other Ambulatory Visit: Payer: Self-pay

## 2020-09-20 VITALS — BP 128/81 | HR 91 | Temp 98.1°F | Ht 72.0 in | Wt 223.4 lb

## 2020-09-20 DIAGNOSIS — M47816 Spondylosis without myelopathy or radiculopathy, lumbar region: Secondary | ICD-10-CM | POA: Diagnosis not present

## 2020-09-20 NOTE — Progress Notes (Signed)
Subjective:    Patient ID: Logan Young, male    DOB: 1981-07-04, 39 y.o.   MRN: [id number]  HPI  38 year old male with chronic low back pain of several years duration.  He has tried physical therapy, nonprescription medications, prescription medications as well as non fluoroscopy guided back injections.  He did undergo a fluoroscopy guided contrast enhanced right S1 epidural injection.  His right lower extremity pain has been doing well however the back pain was not relieved. Right lower ext pain tolerable, has been well controlled with gabapentin, the patient is not sure whether the epidural injection added to his pain relief. The patient gets good results with tramadol 100 mg 4 times daily however he does not like being on medicine all the time.  Back pain still the most bothersome  Still working in a very physical job at [employer], working nights Pain Inventory Average Pain 9 Pain Right Now 3 My pain is constant and aching  In the last 24 hours, has pain interfered with the following? General activity 10 Relation with others 10 Enjoyment of life 10 What TIME of day is your pain at its worst? evening Sleep (in general) Poor  Pain is worse with: walking, bending, sitting, inactivity and standing Pain improves with: heat/ice, pacing activities and medication Relief from Meds: 3  Family History  Problem Relation Age of Onset  . Diabetes Mother   . Cancer Mother        rectal  . Lupus Father   . Cancer Father        stomach   Social History   Socioeconomic History  . Marital status: Legally Separated    Spouse name: Not on file  . Number of children: Not on file  . Years of education: Not on file  . Highest education level: Not on file  Occupational History  . Not on file  Tobacco Use  . Smoking status: Current Every Day Smoker    Packs/day: 1.00    Years: 12.00    Pack years: 12.00  . Smokeless tobacco: Never Used  Vaping Use  . Vaping Use: Never  used  Substance and Sexual Activity  . Alcohol use: Yes    Comment: rare  . Drug use: No  . Sexual activity: Not on file  Other Topics Concern  . Not on file  Social History Narrative  . Not on file   Social Determinants of Health   Financial Resource Strain:   . Difficulty of Paying Living Expenses: Not on file  Food Insecurity:   . Worried About Programme researcher, broadcasting/film/video in the Last Year: Not on file  . Ran Out of Food in the Last Year: Not on file  Transportation Needs:   . Lack of Transportation (Medical): Not on file  . Lack of Transportation (Non-Medical): Not on file  Physical Activity:   . Days of Exercise per Week: Not on file  . Minutes of Exercise per Session: Not on file  Stress:   . Feeling of Stress : Not on file  Social Connections:   . Frequency of Communication with Friends and Family: Not on file  . Frequency of Social Gatherings with Friends and Family: Not on file  . Attends Religious Services: Not on file  . Active Member of Clubs or Organizations: Not on file  . Attends Banker Meetings: Not on file  . Marital Status: Not on file   Past Surgical History:  Procedure Laterality Date  .  APPENDECTOMY  AGE 89  . CHOLECYSTECTOMY  12/2013   Past Surgical History:  Procedure Laterality Date  . APPENDECTOMY  AGE 89  . CHOLECYSTECTOMY  12/2013   Past Medical History:  Diagnosis Date  . Chronic low back pain   . Depression   . Insomnia   . Migraines    BP 128/81   Pulse 91   Temp 98.1 F (36.7 C)   Ht 6' (1.829 m)   Wt 223 lb 6.4 oz (101.3 kg)   SpO2 98%   BMI 30.30 kg/m   Opioid Risk Score:   Fall Risk Score:  `1  Depression screen PHQ 2/9  Depression screen [facility] 2/9 08/23/2020 08/06/2020 02/13/2020 02/13/2020 07/14/2019 11/16/2018 03/24/2017  Decreased Interest 0 3 1 0 0 0 0  Down, Depressed, Hopeless 0 3 2 0 0 0 0  PHQ - 2 Score 0 6 3 0 0 0 0  Altered sleeping - 3 3 - 0 0 -  Tired, decreased energy - 3 3 - 0 0 -  Change in appetite - 0  1 - 0 0 -  Feeling bad or failure about yourself  - 3 1 - 0 0 -  Trouble concentrating - 3 2 - 0 0 -  Moving slowly or fidgety/restless - 0 1 - 0 0 -  Suicidal thoughts - 3 2 - 0 0 -  PHQ-9 Score - 21 16 - 0 0 -  Difficult doing work/chores - Extremely dIfficult Extremely dIfficult - - - -    Review of Systems  Musculoskeletal: Positive for back pain and neck pain.  All other systems reviewed and are negative.      Objective:   Physical Exam Vitals and nursing note reviewed.  Constitutional:      Appearance: He is obese.  HENT:     Head: Normocephalic and atraumatic.  Eyes:     Extraocular Movements: Extraocular movements intact.     Conjunctiva/sclera: Conjunctivae normal.     Pupils: Pupils are equal, round, and reactive to light.  Musculoskeletal:        General: Tenderness present.     Right lower leg: No edema.     Left lower leg: No edema.     Comments: The patient has pain to palpation in the lumbar paraspinal area The patient has pain with right-sided bending as well as extension of spine.  No pain with lumbar spine flexion Negative straight leg raising No hip pain with range of motion  Skin:    General: Skin is warm and dry.  Neurological:     General: No focal deficit present.     Mental Status: He is alert and oriented to person, place, and time.  Psychiatric:        Mood and Affect: Mood normal.        Behavior: Behavior normal.        Thought Content: Thought content normal.        Judgment: Judgment normal.           Assessment & Plan:  #1.  Chronic low back pain, right sciatic pain has been well controlled with gabapentin.  His low back pain control is okay with 4 times daily narcotic analgesics however the patient does not wish to be on these medications long-term.  We discussed potential pain generators including lumbar facet which appears to be likely secondary to pain exacerbating and alleviating factors.  He has right greater than left  paraspinal tenderness and pain with  extension as well as lateral bending  We will schedule for bilateral L3-L4 medial branch and L5 dorsal ramus injection under fluoroscopic guidance to anesthetize the L4-5 and L5-S1 facet joint complexes.  We went over the procedure with the patient including using a spine model.  He is in agreement we will schedule within the next 2 to 3 weeks

## 2020-09-20 NOTE — Patient Instructions (Signed)
Will do test blocks to see if the small facet joints in your back are the major source of your pain

## 2020-10-25 ENCOUNTER — Telehealth: Payer: Self-pay

## 2020-11-13 ENCOUNTER — Ambulatory Visit: Payer: BC Managed Care – PPO | Admitting: Family Medicine

## 2020-11-19 ENCOUNTER — Encounter: Payer: Self-pay | Admitting: Nurse Practitioner

## 2020-11-19 ENCOUNTER — Ambulatory Visit: Payer: BC Managed Care – PPO | Admitting: Nurse Practitioner

## 2020-11-19 ENCOUNTER — Other Ambulatory Visit: Payer: Self-pay

## 2020-11-19 VITALS — BP 115/76 | HR 97 | Temp 97.8°F | Ht 72.0 in | Wt 213.8 lb

## 2020-11-19 DIAGNOSIS — M545 Low back pain, unspecified: Secondary | ICD-10-CM

## 2020-11-19 DIAGNOSIS — G43009 Migraine without aura, not intractable, without status migrainosus: Secondary | ICD-10-CM

## 2020-11-19 DIAGNOSIS — G8929 Other chronic pain: Secondary | ICD-10-CM

## 2020-11-19 MED ORDER — NURTEC 75 MG PO TBDP: ORAL_TABLET | ORAL | 2 refills | Status: DC

## 2020-11-19 NOTE — Assessment & Plan Note (Signed)
Patient continues to report chronic lower back pain.  Patient continues to use tramadol 2 tablets by mouth every 6 hours.  Patient is reporting work and lifting is not getting any easier and uses back brace as needed to help alleviate pressure.  Continue taking pain medicine as prescribed and follow-up with worsening symptoms.

## 2020-11-19 NOTE — Progress Notes (Signed)
Acute Office Visit  Subjective:    Patient ID: Logan Young, male    DOB: April 07, 1981, 40 y.o.   MRN: [id number]  Chief Complaint  Patient presents with  . Migraine    Migraine  This is a new problem. The current episode started more than 1 year ago. The problem occurs intermittently. The problem has been gradually worsening. The pain is located in the occipital and frontal region. The pain does not radiate. The pain quality is similar to prior headaches. The quality of the pain is described as aching and pulsating. The pain is at a severity of 10/10. The pain is severe. Associated symptoms include back pain, insomnia and nausea. Pertinent negatives include no abdominal pain, blurred vision, coughing, dizziness, facial sweating, fever or vomiting. Associated symptoms comments: Nausea . Nothing aggravates the symptoms. He has tried acetaminophen, NSAIDs and Excedrin for the symptoms. The treatment provided mild relief. His past medical history is significant for migraine headaches.     Past Medical History:  Diagnosis Date  . Chronic low back pain   . Depression   . Insomnia   . Migraines     Past Surgical History:  Procedure Laterality Date  . APPENDECTOMY  AGE 21  . CHOLECYSTECTOMY  12/2013    Family History  Problem Relation Age of Onset  . Diabetes Mother   . Cancer Mother        rectal  . Lupus Father   . Cancer Father        stomach    Social History   Socioeconomic History  . Marital status: Legally Separated    Spouse name: Not on file  . Number of children: Not on file  . Years of education: Not on file  . Highest education level: Not on file  Occupational History  . Not on file  Tobacco Use  . Smoking status: Current Every Day Smoker    Packs/day: 1.00    Years: 12.00    Pack years: 12.00  . Smokeless tobacco: Never Used  Vaping Use  . Vaping Use: Never used  Substance and Sexual Activity  . Alcohol use: Yes    Comment: rare  . Drug use: No  .  Sexual activity: Not on file  Other Topics Concern  . Not on file  Social History Narrative  . Not on file   Social Determinants of Health   Financial Resource Strain: Not on file  Food Insecurity: Not on file  Transportation Needs: Not on file  Physical Activity: Not on file  Stress: Not on file  Social Connections: Not on file  Intimate Partner Violence: Not on file    Outpatient Medications Prior to Visit  Medication Sig Dispense Refill  . DULoxetine (CYMBALTA) 30 MG capsule Take 1 capsule (30 mg total) by mouth daily. 30 capsule 5  . meloxicam (MOBIC) 15 MG tablet Take 15 mg by mouth daily.    . traMADol (ULTRAM) 50 MG tablet Take 2 tablets (100 mg total) by mouth every 6 (six) hours as needed. 240 tablet 5  . gabapentin (NEURONTIN) 100 MG capsule Take 100 mg by mouth 2 (two) times daily. (Patient not taking: Reported on 11/19/2020)    . dicyclomine (BENTYL) 20 MG tablet Take 1 tablet (20 mg total) by mouth 4 (four) times daily -  before meals and at bedtime. 120 tablet 1   No facility-administered medications prior to visit.    Allergies  Allergen Reactions  . Sulfa Antibiotics Itching  Review of Systems  Constitutional: Negative for fever.  Eyes: Negative for blurred vision.  Respiratory: Negative for cough and chest tightness.   Cardiovascular: Negative.   Gastrointestinal: Positive for nausea. Negative for abdominal pain and vomiting.  Genitourinary: Negative.   Musculoskeletal: Positive for back pain and myalgias.  Skin: Negative.   Neurological: Positive for headaches. Negative for dizziness.  Psychiatric/Behavioral: The patient has insomnia.   All other systems reviewed and are negative.      Objective:    Physical Exam Vitals reviewed.  Constitutional:      General: He is awake.     Appearance: Normal appearance. He is well-groomed.     Interventions: Face mask in place.  HENT:     Head: Normocephalic.     Nose: Nose normal.  Eyes:      Conjunctiva/sclera: Conjunctivae normal.  Cardiovascular:     Rate and Rhythm: Normal rate and regular rhythm.     Pulses: Normal pulses.     Heart sounds: Normal heart sounds.  Pulmonary:     Effort: Pulmonary effort is normal.     Breath sounds: Normal breath sounds.  Abdominal:     General: Bowel sounds are normal.  Musculoskeletal:        General: Tenderness present.     Cervical back: Normal range of motion.  Skin:    General: Skin is warm.  Neurological:     Mental Status: He is alert and oriented to person, place, and time.  Psychiatric:        Behavior: Behavior normal. Behavior is cooperative.     BP 115/76   Pulse 97   Temp 97.8 F (36.6 C)   Ht 6' (1.829 m)   Wt 213 lb 12.8 oz (97 kg)   SpO2 99%   BMI 29.00 kg/m  Wt Readings from Last 3 Encounters:  11/19/20 213 lb 12.8 oz (97 kg)  09/20/20 223 lb 6.4 oz (101.3 kg)  08/23/20 216 lb 9.6 oz (98.2 kg)    Health Maintenance Due  Topic Date Due  . Hepatitis C Screening  Never done  . COVID-19 Vaccine (1) Never done  . HIV Screening  Never done  . TETANUS/TDAP  Never done  . INFLUENZA VACCINE  Never done    There are no preventive care reminders to display for this patient.      Assessment & Plan:   Problem List Items Addressed This Visit      Cardiovascular and Mediastinum   Migraines    Patient is a 40 year old male who presents to clinic for worsening migraine headache.  Patient reports being diagnosed with migraine as a teenager, symptoms stopped and in the last year he is reporting worsening migraine.  Reports migraine headache lasting 2 to 3 days.  Pain is described as pounding, tension-like headache, nausea, and pain behind eyes, unilateral pain more on the right side.  Prefers a dark room to sit in during migraine episode.  Patient is reporting 1 migraine episode in a month.  Use of Aleve and Excedrin has only mildly helped symptoms.  Lately patient is missing more days of work in the month due to  migraine and experiencing insomnia due to pain.  Advised patient to please take current medication as prescribed, started patient on not take for abortive migraine treatment.  Increase hydration, follow-up with worsening or unresolved symptoms.  Rx sent to pharmacy.         Other   Chronic low back pain - Primary  Patient continues to report chronic lower back pain.  Patient continues to use tramadol 2 tablets by mouth every 6 hours.  Patient is reporting work and lifting is not getting any easier and uses back brace as needed to help alleviate pressure.  Continue taking pain medicine as prescribed and follow-up with worsening symptoms.             No orders of the defined types were placed in this encounter.    Daryll Drown, NP

## 2020-11-19 NOTE — Assessment & Plan Note (Signed)
Patient is a 40 year old male who presents to clinic for worsening migraine headache.  Patient reports being diagnosed with migraine as a teenager, symptoms stopped and in the last year he is reporting worsening migraine.  Reports migraine headache lasting 2 to 3 days.  Pain is described as pounding, tension-like headache, nausea, and pain behind eyes, unilateral pain more on the right side.  Prefers a dark room to sit in during migraine episode.  Patient is reporting 1 migraine episode in a month.  Use of Aleve and Excedrin has only mildly helped symptoms.  Lately patient is missing more days of work in the month due to migraine and experiencing insomnia due to pain.  Advised patient to please take current medication as prescribed, started patient on not take for abortive migraine treatment.  Increase hydration, follow-up with worsening or unresolved symptoms.  Rx sent to pharmacy.

## 2020-11-19 NOTE — Patient Instructions (Signed)

## 2021-02-13 ENCOUNTER — Other Ambulatory Visit: Payer: Self-pay | Admitting: Physical Medicine & Rehabilitation

## 2021-03-21 ENCOUNTER — Ambulatory Visit: Payer: BC Managed Care – PPO | Admitting: Nurse Practitioner

## 2021-05-19 DIAGNOSIS — S338XXA Sprain of other parts of lumbar spine and pelvis, initial encounter: Secondary | ICD-10-CM | POA: Diagnosis not present

## 2021-05-19 DIAGNOSIS — S134XXA Sprain of ligaments of cervical spine, initial encounter: Secondary | ICD-10-CM | POA: Diagnosis not present

## 2021-05-19 DIAGNOSIS — S233XXA Sprain of ligaments of thoracic spine, initial encounter: Secondary | ICD-10-CM | POA: Diagnosis not present

## 2021-05-26 DIAGNOSIS — S338XXA Sprain of other parts of lumbar spine and pelvis, initial encounter: Secondary | ICD-10-CM | POA: Diagnosis not present

## 2021-05-26 DIAGNOSIS — S233XXA Sprain of ligaments of thoracic spine, initial encounter: Secondary | ICD-10-CM | POA: Diagnosis not present

## 2021-05-26 DIAGNOSIS — S134XXA Sprain of ligaments of cervical spine, initial encounter: Secondary | ICD-10-CM | POA: Diagnosis not present

## 2021-06-02 DIAGNOSIS — S134XXA Sprain of ligaments of cervical spine, initial encounter: Secondary | ICD-10-CM | POA: Diagnosis not present

## 2021-06-02 DIAGNOSIS — S338XXA Sprain of other parts of lumbar spine and pelvis, initial encounter: Secondary | ICD-10-CM | POA: Diagnosis not present

## 2021-06-02 DIAGNOSIS — S233XXA Sprain of ligaments of thoracic spine, initial encounter: Secondary | ICD-10-CM | POA: Diagnosis not present

## 2021-09-01 ENCOUNTER — Other Ambulatory Visit: Payer: Self-pay | Admitting: Physical Medicine & Rehabilitation

## 2021-09-04 ENCOUNTER — Other Ambulatory Visit: Payer: Self-pay | Admitting: Physical Medicine & Rehabilitation

## 2021-09-09 ENCOUNTER — Telehealth: Payer: Self-pay | Admitting: Family Medicine

## 2021-09-09 NOTE — Telephone Encounter (Signed)
It is controlled.  He will need a face to face visit/ UDS, etc and I do not have any availability for a while.  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  Can we call them and confirm this is the recommendation?

## 2021-09-09 NOTE — Telephone Encounter (Signed)
If patient schedules an appointment with you is this something you would be willing to do until he is seen by pain clinic?

## 2021-09-09 NOTE — Telephone Encounter (Signed)
Pt is out of his Tramadol Rx and was told by his pain doctor that he needed to contact his PCP and see if she could prescribe him enough medicine to last him until his next pain clinic appt on 10/31/21 (first available). Please advise and call patient.

## 2021-09-10 ENCOUNTER — Ambulatory Visit: Payer: BC Managed Care – PPO | Admitting: Nurse Practitioner

## 2021-09-10 ENCOUNTER — Telehealth: Payer: Self-pay | Admitting: Physical Medicine & Rehabilitation

## 2021-09-10 ENCOUNTER — Other Ambulatory Visit: Payer: Self-pay

## 2021-09-10 ENCOUNTER — Encounter: Payer: Self-pay | Admitting: Nurse Practitioner

## 2021-09-10 VITALS — BP 126/86 | HR 83 | Temp 98.1°F | Resp 20 | Ht 72.0 in | Wt 208.0 lb

## 2021-09-10 DIAGNOSIS — F3289 Other specified depressive episodes: Secondary | ICD-10-CM | POA: Diagnosis not present

## 2021-09-10 DIAGNOSIS — M545 Low back pain, unspecified: Secondary | ICD-10-CM

## 2021-09-10 DIAGNOSIS — F4321 Adjustment disorder with depressed mood: Secondary | ICD-10-CM | POA: Insufficient documentation

## 2021-09-10 DIAGNOSIS — F419 Anxiety disorder, unspecified: Secondary | ICD-10-CM

## 2021-09-10 DIAGNOSIS — G8929 Other chronic pain: Secondary | ICD-10-CM

## 2021-09-10 HISTORY — DX: Anxiety disorder, unspecified: F41.9

## 2021-09-10 MED ORDER — DULOXETINE HCL 60 MG PO CPEP: 60.0000 mg | ORAL_CAPSULE | Freq: Every day | ORAL | 0 refills | Status: DC

## 2021-09-10 NOTE — Assessment & Plan Note (Signed)
Resources for grief counseling/general counseling provided to patient.   Follow-up with worsening unresolved symptoms.

## 2021-09-10 NOTE — Assessment & Plan Note (Signed)
Chronic back pain still not controlled.  Patient is currently on Mobic 15 mg tablet by mouth daily, and tramadol 50 mg tablet by mouth every 6 hours for pain as needed.  Advised patient to continue back strengthening exercises and following with unresolved symptoms.  Physical therapy, orthopedic consultation may be helpful for patient in the future.  Patient verbalized understanding

## 2021-09-10 NOTE — Telephone Encounter (Signed)
Logan Young office called stating Logan Young is requesting pain meds, and they are confused since he is seeing Logan Young for pain management.  Would like to confirm that patient is not receiving pain meds from our office, or if they need it we are prescribing.

## 2021-09-10 NOTE — Telephone Encounter (Signed)
Called and spoke with that office they state they did not see where he was told that they are going to send a message straight to his doctor to see and they will call us back with verification.

## 2021-09-10 NOTE — Telephone Encounter (Signed)
Called and left message for pain clinic to call us back.

## 2021-09-10 NOTE — Telephone Encounter (Signed)
Spoke with Angelica Chessman at [facility] and informed her that the patient has not been seen here since 09/2020 and his last refill request for Tramadol sent to the office was denied by Dr. Wynn Banker. Angelica Chessman stated they just wanted to make sure the patient was not getting controlled medication from 2 different doctors.

## 2021-09-10 NOTE — Progress Notes (Signed)
Acute Office Visit  Subjective:    Patient ID: Logan Young, male    DOB: [birth year], 40 y.o.   MRN: [id number]  Chief Complaint  Patient presents with   Fatigue    Stomach issues Headaches for 3-4 days Trouble sleeping. Father recently passed away    HPI Patient is in today for Depression, Follow-up  He  was last seen for this few years ago. Changes made at last visit include Celexa (patient not currently taking).   He reports fair compliance with treatment. He is not having side effects.   He reports good tolerance of treatment. Current symptoms include: depressed mood, difficulty concentrating, and insomnia He feels he is Worse since last visit.  Depression screen [facility] 2/9 09/10/2021 11/19/2020 08/23/2020  Decreased Interest 3 0 0  Down, Depressed, Hopeless 3 0 0  PHQ - 2 Score 6 0 0  Altered sleeping 3 - -  Tired, decreased energy 3 - -  Change in appetite 1 - -  Feeling bad or failure about yourself  3 - -  Trouble concentrating 3 - -  Moving slowly or fidgety/restless 0 - -  Suicidal thoughts 1 - -  PHQ-9 Score 20 - -  Difficult doing work/chores Extremely dIfficult - -  Some recent data might be hidden    Anxiety, Follow-up  He was last seen for anxiety few years ago. Changes made at last visit include Celexa. Patient is not currently taking medication   He reports fair compliance with treatment. He reports good tolerance of treatment. He is not having side effects.   He feels his anxiety is moderate and Worse since last visit.  Symptoms: No chest pain Yes difficulty concentrating  No dizziness Yes fatigue  Yes feelings of losing control Yes insomnia  No irritable No palpitations  No panic attacks No racing thoughts  No shortness of breath No sweating  No tremors/shakes    GAD-7 Results GAD-7 Generalized Anxiety Disorder Screening Tool 09/10/2021  1. Feeling Nervous, Anxious, or on Edge 1  2. Not Being Able to Stop or Control Worrying 3  3.  Worrying Too Much About Different Things 3  4. Trouble Relaxing 3  5. Being So Restless it's Hard To Sit Still 0  6. Becoming Easily Annoyed or Irritable 1  7. Feeling Afraid As If Something Awful Might Happen 0  Total GAD-7 Score 11  Difficulty At Work, Home, or Getting  Along With Others? Extremely difficult    PHQ-9 Scores PHQ9 SCORE ONLY 09/10/2021 11/19/2020 08/23/2020  PHQ-9 Total Score 20 0 0   Patient is reporting signs and symptoms of grief..  Patient recently lost his dad 2 weeks ago.  And has a sick mother at home.  Past Medical History:  Diagnosis Date   Anxiety 09/10/2021   Chronic low back pain    Depression    Insomnia    Migraines     Past Surgical History:  Procedure Laterality Date   APPENDECTOMY  AGE 51   CHOLECYSTECTOMY  12/2013    Family History  Problem Relation Age of Onset   Diabetes Mother    Cancer Mother        rectal   Lupus Father    Cancer Father        stomach    Social History   Socioeconomic History   Marital status: Legally Separated    Spouse name: Not on file   Number of children: Not on file   Years of education: Not  on file   Highest education level: Not on file  Occupational History   Not on file  Tobacco Use   Smoking status: Every Day    Packs/day: 1.00    Years: 12.00    Pack years: 12.00    Types: Cigarettes   Smokeless tobacco: Never  Vaping Use   Vaping Use: Never used  Substance and Sexual Activity   Alcohol use: Yes    Comment: rare   Drug use: No   Sexual activity: Not on file  Other Topics Concern   Not on file  Social History Narrative   Not on file   Social Determinants of Health   Financial Resource Strain: Not on file  Food Insecurity: Not on file  Transportation Needs: Not on file  Physical Activity: Not on file  Stress: Not on file  Social Connections: Not on file  Intimate Partner Violence: Not on file    Outpatient Medications Prior to Visit  Medication Sig Dispense Refill    Buprenorphine HCl (BELBUCA) 75 MCG FILM Place inside cheek.     gabapentin (NEURONTIN) 100 MG capsule Take 100 mg by mouth 2 (two) times daily.     traMADol (ULTRAM) 50 MG tablet TAKE TWO TABLETS BY MOUTH EVERY 6 HOURS AS NEEDED 240 tablet 5   DULoxetine (CYMBALTA) 30 MG capsule Take 1 capsule (30 mg total) by mouth daily. 30 capsule 5   meloxicam (MOBIC) 15 MG tablet Take 15 mg by mouth daily. (Patient not taking: Reported on 09/10/2021)     Rimegepant Sulfate (NURTEC) 75 MG TBDP 75 mg po every other day, do not exceed 75 mg per 24 hr (Patient not taking: Reported on 09/10/2021) 30 tablet 2   No facility-administered medications prior to visit.    Allergies  Allergen Reactions   Sulfa Antibiotics Itching    Review of Systems  Constitutional: Negative.   HENT: Negative.    Respiratory: Negative.    Cardiovascular: Negative.   Gastrointestinal: Negative.   Musculoskeletal:  Positive for back pain.  Skin:  Negative for rash.  Psychiatric/Behavioral:  The patient is nervous/anxious.   All other systems reviewed and are negative.     Objective:    Physical Exam Vitals and nursing note reviewed.  Constitutional:      Appearance: Normal appearance.  HENT:     Head: Normocephalic.     Mouth/Throat:     Mouth: Mucous membranes are moist.     Pharynx: Oropharynx is clear.  Eyes:     Conjunctiva/sclera: Conjunctivae normal.  Cardiovascular:     Rate and Rhythm: Normal rate and regular rhythm.     Pulses: Normal pulses.     Heart sounds: Normal heart sounds.  Pulmonary:     Effort: Pulmonary effort is normal.     Breath sounds: Normal breath sounds.  Abdominal:     General: Bowel sounds are normal.  Musculoskeletal:     Lumbar back: Tenderness present.  Skin:    General: Skin is warm.     Findings: No rash.  Neurological:     Mental Status: He is alert and oriented to person, place, and time.  Psychiatric:        Attention and Perception: Attention and perception normal.         Mood and Affect: Mood is anxious and depressed.        Speech: Speech normal.        Behavior: Behavior normal. Behavior is cooperative.  Thought Content: Thought content normal.    BP 126/86   Pulse 83   Temp 98.1 F (36.7 C) (Temporal)   Resp 20   Ht 6' (1.829 m)   Wt 208 lb (94.3 kg)   SpO2 96%   BMI 28.21 kg/m  Wt Readings from Last 3 Encounters:  09/10/21 208 lb (94.3 kg)  11/19/20 213 lb 12.8 oz (97 kg)  09/20/20 223 lb 6.4 oz (101.3 kg)    Health Maintenance Due  Topic Date Due   HIV Screening  Never done   Hepatitis C Screening  Never done            Assessment & Plan:   Problem List Items Addressed This Visit       Other   Chronic low back pain    Chronic back pain still not controlled.  Patient is currently on Mobic 15 mg tablet by mouth daily, and tramadol 50 mg tablet by mouth every 6 hours for pain as needed.  Advised patient to continue back strengthening exercises and following with unresolved symptoms.  Physical therapy, orthopedic consultation may be helpful for patient in the future.  Patient verbalized understanding       Relevant Medications   Buprenorphine HCl (BELBUCA) 75 MCG FILM   DULoxetine (CYMBALTA) 60 MG capsule   Depression - Primary    Uncontrolled depression due to complicated grief, patient recently lost his dad and his mother has stroke which has made his family situation a little complicated.  Provided resources for grief counseling.  Patient has a history of depression from 2016 and was treated with Celexa.  Patient is not currently on medication.  I did increase the dose of his Cymbalta from 30 mg tablet by mouth daily to 60 mg tablet by mouth daily and follow-up in 6 weeks with PCP.  Advised patient to follow-up with worsening unresolved symptoms  Rx sent to pharmacy.       Relevant Medications   DULoxetine (CYMBALTA) 60 MG capsule   Anxiety    Uncontrolled anxiety due to complicated grief, patient recently  lost his dad and his mother has stroke which has made his family situation a little complicated.  Provided resources for grief counseling.  Patient has a history of depression from 2016 and was treated with Celexa.  Patient is not currently on medication.  I did increase the dose of his Cymbalta from 30 mg tablet by mouth daily to 60 mg tablet by mouth daily and follow-up in 6 weeks with PCP.  Advised patient to follow-up with worsening unresolved symptoms  Rx sent to pharmacy.      Relevant Medications   DULoxetine (CYMBALTA) 60 MG capsule   Complicated grief    Resources for grief counseling/general counseling provided to patient.   Follow-up with worsening unresolved symptoms.        Meds ordered this encounter  Medications   DULoxetine (CYMBALTA) 60 MG capsule    Sig: Take 1 capsule (60 mg total) by mouth daily.    Dispense:  60 capsule    Refill:  0    Order Specific Question:   Supervising Provider    Answer:   Mechele Claude [[id number]]     Daryll Drown, NP

## 2021-09-10 NOTE — Assessment & Plan Note (Signed)
Uncontrolled depression due to complicated grief, patient recently lost his dad and his mother has stroke which has made his family situation a little complicated.  Provided resources for grief counseling.  Patient has a history of depression from 2016 and was treated with Celexa.  Patient is not currently on medication.  I did increase the dose of his Cymbalta from 30 mg tablet by mouth daily to 60 mg tablet by mouth daily and follow-up in 6 weeks with PCP.  Advised patient to follow-up with worsening unresolved symptoms  Rx sent to pharmacy.

## 2021-09-10 NOTE — Assessment & Plan Note (Signed)
Uncontrolled anxiety due to complicated grief, patient recently lost his dad and his mother has stroke which has made his family situation a little complicated.  Provided resources for grief counseling.  Patient has a history of depression from 2016 and was treated with Celexa.  Patient is not currently on medication.  I did increase the dose of his Cymbalta from 30 mg tablet by mouth daily to 60 mg tablet by mouth daily and follow-up in 6 weeks with PCP.  Advised patient to follow-up with worsening unresolved symptoms  Rx sent to pharmacy.

## 2021-09-10 NOTE — Telephone Encounter (Signed)
Called his pain clinic they are not open will call at a later time

## 2021-09-10 NOTE — Patient Instructions (Signed)
Managing Depression, Adult Depression is a mental health condition that affects your thoughts, feelings, and actions. Being diagnosed with depression can bring you relief if you did not know why you have felt or behaved a certain way. It could also leave you feeling overwhelmed with uncertainty about your future. Preparing yourself to manage your symptoms can help you feel more positive about your future. How to manage lifestyle changes Managing stress Stress is your body's reaction to life changes and events, both good and bad. Stress can add to your feelings of depression. Learning to manage your stress can help lessen your feelings of depression. Try some of the following approaches to reducing your stress (stress reduction techniques): Listen to music that you enjoy and that inspires you. Try using a meditation app or take a meditation class. Develop a practice that helps you connect with your spiritual self. Walk in nature, pray, or go to a place of worship. Do some deep breathing. To do this, inhale slowly through your nose. Pause at the top of your inhale for a few seconds and then exhale slowly, letting your muscles relax. Practice yoga to help relax and work your muscles. Choose a stress reduction technique that suits your lifestyle and personality. These techniques take time and practice to develop. Set aside 5-15 minutes a day to do them. Therapists can offer training in these techniques. Other things you can do to manage stress include: Keeping a stress diary. Knowing your limits and saying no when you think something is too much. Paying attention to how you react to certain situations. You may not be able to control everything, but you can change your reaction. Adding humor to your life by watching funny films or TV shows. Making time for activities that you enjoy and that relax you.  Medicines Medicines, such as antidepressants, are often a part of treatment for depression. Talk  with your pharmacist or health care provider about all the medicines, supplements, and herbal products that you take, their possible side effects, and what medicines and other products are safe to take together. Make sure to report any side effects you may have to your health care provider. Relationships Your health care provider may suggest family therapy, couples therapy, or individual therapy as part of your treatment. How to recognize changes Everyone responds differently to treatment for depression. As you recover from depression, you may start to: Have more interest in doing activities. Feel less hopeless. Have more energy. Overeat less often, or have a better appetite. Have better mental focus. It is important to recognize if your depression is not getting better or is getting worse. The symptoms you had in the beginning may return, such as: Tiredness (fatigue) or low energy. Eating too much or too little. Sleeping too much or too little. Feeling restless, agitated, or hopeless. Trouble focusing or making decisions. Unexplained physical complaints. Feeling irritable, angry, or aggressive. If you or your family members notice these symptoms coming back, let your health care provider know right away. Follow these instructions at home: Activity  Try to get some form of exercise each day, such as walking, biking, swimming, or lifting weights. Practice stress reduction techniques. Engage your mind by taking a class or doing some volunteer work. Lifestyle Get the right amount and quality of sleep. Cut down on using caffeine, tobacco, alcohol, and other potentially harmful substances. Eat a healthy diet that includes plenty of vegetables, fruits, whole grains, low-fat dairy products, and lean protein. Do not eat a lot   of foods that are high in solid fats, added sugars, or salt (sodium). General instructions Take over-the-counter and prescription medicines only as told by your health  care provider. Keep all follow-up visits as told by your health care provider. This is important. Where to find support Talking to others Friends and family members can be sources of support and guidance. Talk to trusted friends or family members about your condition. Explain your symptoms to them, and let them know that you are working with a health care provider to treat your depression. Tell friends and family members how they also can be helpful. Finances Find appropriate mental health providers that fit with your financial situation. Talk with your health care provider about options to get reduced prices on your medicines. Where to find more information You can find support in your area from: Anxiety and Depression Association of America (ADAA): www.adaa.org Mental Health America: www.mentalhealthamerica.net [employer] on Mental Illness: www.nami.org Contact a health care provider if: You stop taking your antidepressant medicines, and you have any of these symptoms: Nausea. Headache. Light-headedness. Chills and body aches. Not being able to sleep (insomnia). You or your friends and family think your depression is getting worse. Get help right away if: You have thoughts of hurting yourself or others. If you ever feel like you may hurt yourself or others, or have thoughts about taking your own life, get help right away. Go to your nearest emergency department or: Call your local emergency services (911 in the [place] Call a suicide crisis helpline, such as the National Suicide Prevention Lifeline at 803-308-4404 or 988 in the [place] This is open 24 hours a day in the [place] Text the Crisis Text Line at 478-581-3049 (in the [place] Summary If you are diagnosed with depression, preparing yourself to manage your symptoms is a good way to feel positive about your future. Work with your health care provider on a management plan that includes stress reduction techniques, medicines (if  applicable), therapy, and healthy lifestyle habits. Keep talking with your health care provider about how your treatment is working. If you have thoughts about taking your own life, call a suicide crisis helpline or text a crisis text line. This information is not intended to replace advice given to you by your health care provider. Make sure you discuss any questions you have with your health care provider. Document Revised: 04/30/2021 Document Reviewed: 08/16/2019 Elsevier Patient Education  2022 Elsevier Inc. Generalized Anxiety Disorder, Adult Generalized anxiety disorder (GAD) is a mental health condition. Unlike normal worries, anxiety related to GAD is not triggered by a specific event. These worries do not fade or get better with time. GAD interferes with relationships, work, and school. GAD symptoms can vary from mild to severe. People with severe GAD can have intense waves of anxiety with physical symptoms that are similar to panic attacks. What are the causes? The exact cause of GAD is not known, but the following are believed to have an impact: Differences in natural brain chemicals. Genes passed down from parents to children. Differences in the way threats are perceived. Development and stress during childhood. Personality. What increases the risk? The following factors may make you more likely to develop this condition: Being male. Having a family history of anxiety disorders. Being very shy. Experiencing very stressful life events, such as the death of a loved one. Having a very stressful family environment. What are the signs or symptoms? People with GAD often worry excessively about many things in their  lives, such as their health and family. Symptoms may also include: Mental and emotional symptoms: Worrying excessively about natural disasters. Fear of being late. Difficulty concentrating. Fears that others are judging your performance. Physical  symptoms: Fatigue. Headaches, muscle tension, muscle twitches, trembling, or feeling shaky. Feeling like your heart is pounding or beating very fast. Feeling out of breath or like you cannot take a deep breath. Having trouble falling asleep or staying asleep, or experiencing restlessness. Sweating. Nausea, diarrhea, or irritable bowel syndrome (IBS). Behavioral symptoms: Experiencing erratic moods or irritability. Avoidance of new situations. Avoidance of people. Extreme difficulty making decisions. How is this diagnosed? This condition is diagnosed based on your symptoms and medical history. You will also have a physical exam. Your health care provider may perform tests to rule out other possible causes of your symptoms. To be diagnosed with GAD, a person must have anxiety that: Is out of his or her control. Affects several different aspects of his or her life, such as work and relationships. Causes distress that makes him or her unable to take part in normal activities. Includes at least three symptoms of GAD, such as restlessness, fatigue, trouble concentrating, irritability, muscle tension, or sleep problems. Before your health care provider can confirm a diagnosis of GAD, these symptoms must be present more days than they are not, and they must last for 6 months or longer. How is this treated? This condition may be treated with: Medicine. Antidepressant medicine is usually prescribed for long-term daily control. Anti-anxiety medicines may be added in severe cases, especially when panic attacks occur. Talk therapy (psychotherapy). Certain types of talk therapy can be helpful in treating GAD by providing support, education, and guidance. Options include: Cognitive behavioral therapy (CBT). People learn coping skills and self-calming techniques to ease their physical symptoms. They learn to identify unrealistic thoughts and behaviors and to replace them with more appropriate thoughts and  behaviors. Acceptance and commitment therapy (ACT). This treatment teaches people how to be mindful as a way to cope with unwanted thoughts and feelings. Biofeedback. This process trains you to manage your body's response (physiological response) through breathing techniques and relaxation methods. You will work with a therapist while machines are used to monitor your physical symptoms. Stress management techniques. These include yoga, meditation, and exercise. A mental health specialist can help determine which treatment is best for you. Some people see improvement with one type of therapy. However, other people require a combination of therapies. Follow these instructions at home: Lifestyle Maintain a consistent routine and schedule. Anticipate stressful situations. Create a plan and allow extra time to work with your plan. Practice stress management or self-calming techniques that you have learned from your therapist or your health care provider. Exercise regularly and spend time outdoors. Eat a healthy diet that includes plenty of vegetables, fruits, whole grains, low-fat dairy products, and lean protein. Do not eat a lot of foods that are high in fat, added sugar, or salt (sodium). Drink plenty of water. Avoid alcohol. Alcohol can increase anxiety. Avoid caffeine and certain over-the-counter cold medicines. These may make you feel worse. Ask your pharmacist which medicines to avoid. General instructions Take over-the-counter and prescription medicines only as told by your health care provider. Understand that you are likely to have setbacks. Accept this and be kind to yourself as you persist to take better care of yourself. Anticipate stressful situations. Create a plan and allow extra time to work with your plan. Recognize and accept your accomplishments, even if  you judge them as small. Spend time with people who care about you. Keep all follow-up visits. This is important. Where to  find more information [employer] of Mental Health: http://www.maynard.net/ Substance Abuse and Mental Health Services: SkateOasis.com.pt Contact a health care provider if: Your symptoms do not get better. Your symptoms get worse. You have signs of depression, such as: A persistently sad or irritable mood. Loss of enjoyment in activities that used to bring you joy. Change in weight or eating. Changes in sleeping habits. Get help right away if: You have thoughts about hurting yourself or others. If you ever feel like you may hurt yourself or others, or have thoughts about taking your own life, get help right away. Go to your nearest emergency department or: Call your local emergency services (911 in the [place] Call a suicide crisis helpline, such as the National Suicide Prevention Lifeline at 321-295-1435 or 988 in the [place] This is open 24 hours a day in the [place] Text the Crisis Text Line at 412-178-2003 (in the [place] Summary Generalized anxiety disorder (GAD) is a mental health condition that involves worry that is not triggered by a specific event. People with GAD often worry excessively about many things in their lives, such as their health and family. GAD may cause symptoms such as restlessness, trouble concentrating, sleep problems, frequent sweating, nausea, diarrhea, headaches, and trembling or muscle twitching. A mental health specialist can help determine which treatment is best for you. Some people see improvement with one type of therapy. However, other people require a combination of therapies. This information is not intended to replace advice given to you by your health care provider. Make sure you discuss any questions you have with your health care provider. Document Revised: 04/30/2021 Document Reviewed: 01/26/2021 Elsevier Patient Education  2022 [employer].

## 2021-09-16 NOTE — Telephone Encounter (Signed)
Calling back to check on the status of pt getting pain meds. Please call back and advise.

## 2021-09-16 NOTE — Telephone Encounter (Signed)
PT AWARE DR. GOTTSCHALK WILL NOT REFILL THIS MEDICATION THAT HE NEEDS TO KEEP APPOINTMENT WITH PAIN MANAGEMENT

## 2021-10-27 ENCOUNTER — Encounter: Payer: Self-pay | Admitting: *Deleted

## 2021-10-28 ENCOUNTER — Encounter: Payer: Self-pay | Admitting: Family Medicine

## 2021-10-28 ENCOUNTER — Ambulatory Visit: Payer: BC Managed Care – PPO | Admitting: Family Medicine

## 2021-10-28 VITALS — BP 138/80 | HR 91 | Temp 97.9°F | Ht 72.0 in | Wt 217.0 lb

## 2021-10-28 DIAGNOSIS — F4321 Adjustment disorder with depressed mood: Secondary | ICD-10-CM | POA: Diagnosis not present

## 2021-10-28 DIAGNOSIS — F330 Major depressive disorder, recurrent, mild: Secondary | ICD-10-CM | POA: Diagnosis not present

## 2021-10-28 DIAGNOSIS — F4381 Prolonged grief disorder: Secondary | ICD-10-CM

## 2021-10-28 DIAGNOSIS — R252 Cramp and spasm: Secondary | ICD-10-CM

## 2021-10-28 DIAGNOSIS — F419 Anxiety disorder, unspecified: Secondary | ICD-10-CM

## 2021-10-28 DIAGNOSIS — F17200 Nicotine dependence, unspecified, uncomplicated: Secondary | ICD-10-CM

## 2021-10-28 DIAGNOSIS — R5383 Other fatigue: Secondary | ICD-10-CM

## 2021-10-28 DIAGNOSIS — Z8042 Family history of malignant neoplasm of prostate: Secondary | ICD-10-CM

## 2021-10-28 NOTE — Patient Instructions (Signed)
Consider Wellbutrin for depression and to help you stop smoking.  Let me know if you want this  I have ordered labs to evaluate your cramping, low energy and check your prostate level. Please come in at 8am sharp to have these drawn.  You don't need an appointment to get them done but you are welcome to schedule one with lab if you need the automated reminder to come in.  Let's see each other back in a month or so for a physical and so you can update me on what the plan is for your back.

## 2021-10-28 NOTE — Progress Notes (Signed)
Subjective: JG:GEZMOQHUTM/ anxiety PCP: Janora Norlander, DO LYY:TKPTWS Logan Young is a 41 y.o. male presenting to clinic today for:  1. Depression/ anxiety; chronic back pain; grief reaction He was seen in December for various concerns but under controlled depression and anxiety were cited not to be secondary to grief reaction and ongoing issues with his uncontrolled back pain.  His Cymbalta was advanced to 60 mg daily.  His symptoms have gotten very slightly better but overall symptoms remain unchanged.  He has an appointment with his pain specialist in the next week or so to resume pain medications.  He apparently has gotten 4 different back injections and none of them have been especially helpful.  He had not seen his pain specialist because they wanted to proceed with more back injections and he felt that these were unnecessary.  However, he does admit that they will not proceed with any additional interventions like nerve ablation or surgical intervention until he has tried these again.  He admits that the pain has become quite cumbersome.  In fact he often has cramps in his legs that wake him up from sleep.  He reports low energy, decreased drive.  He continues to mourn the passing of his father, who passed away from complications of prostate cancer.  His disabled mother, who suffered a CVA, currently resides with him and he is doing "the best that he can for her".  He has consider grief counseling but does not feel that this would be especially helpful for him.  He continues to smoke heavily.  He identifies this as something that is not good for him and he has contemplated smoking cessation but has failed with nicotine patches in the past.  He has never used Wellbutrin.  He has been trying to incorporate some exercise to promote weight loss in efforts to improve back pain but has not noticed much difference with this so far.   ROS: Per HPI  Allergies  Allergen Reactions   Sulfa  Antibiotics Itching   Past Medical History:  Diagnosis Date   Anxiety 09/10/2021   Chronic low back pain    Depression    Insomnia    Migraines     Current Outpatient Medications:    DULoxetine (CYMBALTA) 60 MG capsule, Take 1 capsule (60 mg total) by mouth daily., Disp: 60 capsule, Rfl: 0   gabapentin (NEURONTIN) 100 MG capsule, Take 100 mg by mouth 2 (two) times daily., Disp: , Rfl:    meloxicam (MOBIC) 15 MG tablet, Take 15 mg by mouth daily. (Patient not taking: Reported on 09/10/2021), Disp: , Rfl:    Rimegepant Sulfate (NURTEC) 75 MG TBDP, 75 mg po every other day, do not exceed 75 mg per 24 hr (Patient not taking: Reported on 09/10/2021), Disp: 30 tablet, Rfl: 2   traMADol (ULTRAM) 50 MG tablet, TAKE TWO TABLETS BY MOUTH EVERY 6 HOURS AS NEEDED, Disp: 240 tablet, Rfl: 5 Social History   Socioeconomic History   Marital status: Legally Separated    Spouse name: Not on file   Number of children: Not on file   Years of education: Not on file   Highest education level: Not on file  Occupational History   Not on file  Tobacco Use   Smoking status: Every Day    Packs/day: 1.00    Years: 12.00    Pack years: 12.00    Types: Cigarettes   Smokeless tobacco: Never  Vaping Use   Vaping Use: Never used  Substance  and Sexual Activity   Alcohol use: Yes    Comment: rare   Drug use: No   Sexual activity: Not on file  Other Topics Concern   Not on file  Social History Narrative   Not on file   Social Determinants of Health   Financial Resource Strain: Not on file  Food Insecurity: Not on file  Transportation Needs: Not on file  Physical Activity: Not on file  Stress: Not on file  Social Connections: Not on file  Intimate Partner Violence: Not on file   Family History  Problem Relation Age of Onset   Diabetes Mother    Cancer Mother        rectal   Lupus Father    Cancer Father        stomach    Objective: Office vital signs reviewed. BP 138/80    Pulse 91     Temp 97.9 F (36.6 C) (Temporal)    Ht 6' (1.829 m)    Wt 217 lb (98.4 kg)    SpO2 98%    BMI 29.43 kg/m   Physical Examination:  General: Awake, alert, nontoxic-appearing male, No acute distress HEENT: No exophthalmos or goiter Cardio: regular rate and rhythm  Pulm: normal work of breathing on room air MSK: Ambulating independently with normal tone Psych: Mood depressed.  Patient is pleasant, interactive.  His thought process is linear.  Eye contact fair.  Depression screen [facility] 2/9 10/28/2021 09/10/2021 11/19/2020  Decreased Interest 3 3 0  Down, Depressed, Hopeless 3 3 0  PHQ - 2 Score 6 6 0  Altered sleeping 3 3 -  Tired, decreased energy 3 3 -  Change in appetite 0 1 -  Feeling bad or failure about yourself  1 3 -  Trouble concentrating 3 3 -  Moving slowly or fidgety/restless 0 0 -  Suicidal thoughts 0 1 -  PHQ-9 Score 16 20 -  Difficult doing work/chores Extremely dIfficult Extremely dIfficult -  Some recent data might be hidden   GAD 7 : Generalized Anxiety Score 10/28/2021 09/10/2021  Nervous, Anxious, on Edge 1 1  Control/stop worrying 3 3  Worry too much - different things 3 3  Trouble relaxing 3 3  Restless 0 0  Easily annoyed or irritable 3 1  Afraid - awful might happen 1 0  Total GAD 7 Score 14 11  Anxiety Difficulty Extremely difficult Extremely difficult     Assessment/ Plan: 41 y.o. male   Mild episode of recurrent major depressive disorder (HCC)  Anxiety  Complicated grief  Low energy - Plan: Testosterone, CMP14+EGFR, CBC, Ferritin, Vitamin B12, TSH, T4, free, Bayer DCA Hb A1c Waived  Family history of prostate cancer in father - Plan: PSA  Leg cramping - Plan: CMP14+EGFR, Magnesium, CBC, Ferritin  Tobacco use disorder  Anxiety depression are not especially controlled.  I suspect this largely has to do with the fact that he has ongoing grief reaction in the setting of his father's death and continues to suffer from uncontrolled back pain with  uncertain resolution.  I offered consideration for addition of Wellbutrin which would also help with some smoking cessation but he would like to hold off on this for now.  I think this is reasonable.  I encouraged him to contact me should his mind change prior to her next visit  For his low energy I am going to look for any metabolic reasons why this may be occurring but I suspect again that this may  be related to uncontrolled pain, depression and the medications that he requires to control pain being sedating.  Given his family history of prostate cancer I am going to check a PSA.  Advised not to have intercourse for at least 24 hours prior to this collection  I think the leg cramping he has been experiencing is likely to do with his low back issues but we will look for metabolic and electrolyte abnormalities.  Patient is contemplative about smoking cessation but has failed nicotine patches.  He is considering Wellbutrin as a possible treatment  No orders of the defined types were placed in this encounter.  No orders of the defined types were placed in this encounter.    Janora Norlander, DO [place] [phone]

## 2021-10-31 ENCOUNTER — Encounter
Payer: BC Managed Care – PPO | Attending: Physical Medicine & Rehabilitation | Admitting: Physical Medicine & Rehabilitation

## 2021-10-31 ENCOUNTER — Encounter: Payer: Self-pay | Admitting: Physical Medicine & Rehabilitation

## 2021-10-31 ENCOUNTER — Other Ambulatory Visit: Payer: Self-pay

## 2021-10-31 VITALS — BP 145/95 | HR 111 | Ht 72.0 in | Wt 217.0 lb

## 2021-10-31 DIAGNOSIS — M5136 Other intervertebral disc degeneration, lumbar region: Secondary | ICD-10-CM | POA: Diagnosis not present

## 2021-10-31 DIAGNOSIS — M47816 Spondylosis without myelopathy or radiculopathy, lumbar region: Secondary | ICD-10-CM | POA: Diagnosis not present

## 2021-10-31 MED ORDER — CYCLOBENZAPRINE HCL 10 MG PO TABS: 10.0000 mg | ORAL_TABLET | Freq: Every day | ORAL | 0 refills | Status: DC

## 2021-10-31 MED ORDER — TRAMADOL HCL 50 MG PO TABS: 100.0000 mg | ORAL_TABLET | Freq: Four times a day (QID) | ORAL | 5 refills | Status: DC

## 2021-10-31 NOTE — Patient Instructions (Signed)
Back Exercises °These exercises help to make your trunk and back strong. They also help to keep the lower back flexible. Doing these exercises can help to prevent or lessen pain in your lower back. °If you have back pain, try to do these exercises 2-3 times each day or as told by your doctor. °As you get better, do the exercises once each day. Repeat the exercises more often as told by your doctor. °To stop back pain from coming back, do the exercises once each day, or as told by your doctor. °Do exercises exactly as told by your doctor. Stop right away if you feel sudden pain or your pain gets worse. °Exercises °Single knee to chest °Do these steps 3-5 times in a row for each leg: °Lie on your back on a firm bed or the floor with your legs stretched out. °Bring one knee to your chest. °Grab your knee or thigh with both hands and hold it in place. °Pull on your knee until you feel a gentle stretch in your lower back or butt. °Keep doing the stretch for 10-30 seconds. °Slowly let go of your leg and straighten it. °Pelvic tilt °Do these steps 5-10 times in a row: °Lie on your back on a firm bed or the floor with your legs stretched out. °Bend your knees so they point up to the ceiling. Your feet should be flat on the floor. °Tighten your lower belly (abdomen) muscles to press your lower back against the floor. This will make your tailbone point up to the ceiling instead of pointing down to your feet or the floor. °Stay in this position for 5-10 seconds while you gently tighten your muscles and breathe evenly. °Cat-cow °Do these steps until your lower back bends more easily: °Get on your hands and knees on a firm bed or the floor. Keep your hands under your shoulders, and keep your knees under your hips. You may put padding under your knees. °Let your head hang down toward your chest. Tighten (contract) the muscles in your belly. Point your tailbone toward the floor so your lower back becomes rounded like the back of a  cat. °Stay in this position for 5 seconds. °Slowly lift your head. Let the muscles of your belly relax. Point your tailbone up toward the ceiling so your back forms a sagging arch like the back of a cow. °Stay in this position for 5 seconds. ° °Press-ups °Do these steps 5-10 times in a row: °Lie on your belly (face-down) on a firm bed or the floor. °Place your hands near your head, about shoulder-width apart. °While you keep your back relaxed and keep your hips on the floor, slowly straighten your arms to raise the top half of your body and lift your shoulders. Do not use your back muscles. You may change where you place your hands to make yourself more comfortable. °Stay in this position for 5 seconds. Keep your back relaxed. °Slowly return to lying flat on the floor. ° °Bridges °Do these steps 10 times in a row: °Lie on your back on a firm bed or the floor. °Bend your knees so they point up to the ceiling. Your feet should be flat on the floor. Your arms should be flat at your sides, next to your body. °Tighten your butt muscles and lift your butt off the floor until your waist is almost as high as your knees. If you do not feel the muscles working in your butt and the back of   your thighs, slide your feet 1-2 inches (2.5-5 cm) farther away from your butt. °Stay in this position for 3-5 seconds. °Slowly lower your butt to the floor, and let your butt muscles relax. °If this exercise is too easy, try doing it with your arms crossed over your chest. °Belly crunches °Do these steps 5-10 times in a row: °Lie on your back on a firm bed or the floor with your legs stretched out. °Bend your knees so they point up to the ceiling. Your feet should be flat on the floor. °Cross your arms over your chest. °Tip your chin a little bit toward your chest, but do not bend your neck. °Tighten your belly muscles and slowly raise your chest just enough to lift your shoulder blades a tiny bit off the floor. Avoid raising your body  higher than that because it can put too much stress on your lower back. °Slowly lower your chest and your head to the floor. °Back lifts °Do these steps 5-10 times in a row: °Lie on your belly (face-down) with your arms at your sides, and rest your forehead on the floor. °Tighten the muscles in your legs and your butt. °Slowly lift your chest off the floor while you keep your hips on the floor. Keep the back of your head in line with the curve in your back. Look at the floor while you do this. °Stay in this position for 3-5 seconds. °Slowly lower your chest and your face to the floor. °Contact a doctor if: °Your back pain gets a lot worse when you do an exercise. °Your back pain does not get better within 2 hours after you exercise. °If you have any of these problems, stop doing the exercises. Do not do them again unless your doctor says it is okay. °Get help right away if: °You have sudden, very bad back pain. If this happens, stop doing the exercises. Do not do them again unless your doctor says it is okay. °This information is not intended to replace advice given to you by your health care provider. Make sure you discuss any questions you have with your health care provider. °Document Revised: 12/18/2020 Document Reviewed: 12/18/2020 °Elsevier Patient Education © 2022 [employer] ° °

## 2021-10-31 NOTE — Progress Notes (Signed)
Subjective:    Patient ID: Logan Young, male    DOB: 1981/09/12, 41 y.o.   MRN: [id number]  HPI 41 year old male with chronic low back pain.  He also had 1 episode of left leg giving away he states that he was sitting for a while and he stood up it was fairly numb and could not support his weight.  He had no injury with this.  This event occurred 4 or 5 days ago.  His last MRI on 5/26-2021 demonstrated no significant stenosis at T12-L3.  The patient did have central disc protrusion without stenosis at L3-4 At L4-5 had right asymmetric disc bulge and facet degenerative changes And L5-S1 disc bulge with superimposed right central protrusion showing displacement of right S1 nerve root  The patient is also complained of muscle spasms in both lower limbs.  He states he has an appointment for some blood work ordered by his primary care physician. He has been off of tramadol for about a month, did not suffer any withdrawal symptoms.  Does not endorse any symptoms of anxiety or sweating   Excercising some, weight fluctuation 200-220  On duloxetine 60mg  daily and Tramadol 100mg  QID Pain Inventory Average Pain 10 Pain Right Now 8 My pain is constant, dull, and aching  In the last 24 hours, has pain interfered with the following? General activity 10 Relation with others 10 Enjoyment of life 10 What TIME of day is your pain at its worst? evening and night Sleep (in general) Poor  Pain is worse with: sitting and inactivity Pain improves with: heat/ice, pacing activities, and medication Relief from Meds: 2  Family History  Problem Relation Age of Onset   Diabetes Mother    Cancer Mother        rectal   Lupus Father    Cancer Father        stomach   Social History   Socioeconomic History   Marital status: Legally Separated    Spouse name: Not on file   Number of children: Not on file   Years of education: Not on file   Highest education level: Not on file  Occupational History    Not on file  Tobacco Use   Smoking status: Every Day    Packs/day: 1.00    Years: 12.00    Pack years: 12.00    Types: Cigarettes   Smokeless tobacco: Never  Vaping Use   Vaping Use: Never used  Substance and Sexual Activity   Alcohol use: Yes    Comment: rare   Drug use: No   Sexual activity: Not on file  Other Topics Concern   Not on file  Social History Narrative   Not on file   Social Determinants of Health   Financial Resource Strain: Not on file  Food Insecurity: Not on file  Transportation Needs: Not on file  Physical Activity: Not on file  Stress: Not on file  Social Connections: Not on file   Past Surgical History:  Procedure Laterality Date   APPENDECTOMY  AGE 20   CHOLECYSTECTOMY  12/2013   Past Surgical History:  Procedure Laterality Date   APPENDECTOMY  AGE 20   CHOLECYSTECTOMY  12/2013   Past Medical History:  Diagnosis Date   Anxiety 09/10/2021   Chronic low back pain    Depression    Insomnia    Migraines    Ht 6' (1.829 m)    Wt 217 lb (98.4 kg)    BMI 29.43 kg/m  Opioid Risk Score:   Fall Risk Score:  `1  Depression screen PHQ 2/9  Depression screen [facility] 2/9 10/31/2021 10/28/2021 09/10/2021 11/19/2020 08/23/2020 08/06/2020 02/13/2020  Decreased Interest 0 3 3 0 0 3 1  Down, Depressed, Hopeless 3 3 3  0 0 3 2  PHQ - 2 Score 3 6 6  0 0 6 3  Altered sleeping - 3 3 - - 3 3  Tired, decreased energy - 3 3 - - 3 3  Change in appetite - 0 1 - - 0 1  Feeling bad or failure about yourself  - 1 3 - - 3 1  Trouble concentrating - 3 3 - - 3 2  Moving slowly or fidgety/restless - 0 0 - - 0 1  Suicidal thoughts - 0 1 - - 3 2  PHQ-9 Score - 16 20 - - 21 16  Difficult doing work/chores - Extremely dIfficult Extremely dIfficult - - Extremely dIfficult Extremely dIfficult  Some recent data might be hidden     Review of Systems  Constitutional: Negative.   HENT: Negative.    Eyes: Negative.   Respiratory: Negative.    Cardiovascular: Negative.    Gastrointestinal: Negative.   Endocrine: Negative.   Genitourinary: Negative.   Musculoskeletal:  Positive for back pain and neck pain.  Skin: Negative.   Allergic/Immunologic: Negative.   Neurological: Negative.   Hematological: Negative.   Psychiatric/Behavioral:  Positive for dysphoric mood and sleep disturbance.       Objective:   Physical Exam Vitals and nursing note reviewed.  Constitutional:      Appearance: He is obese.  HENT:     Head: Normocephalic and atraumatic.  Eyes:     Extraocular Movements: Extraocular movements intact.     Conjunctiva/sclera: Conjunctivae normal.     Pupils: Pupils are equal, round, and reactive to light.  Musculoskeletal:     Comments: Patient without palpation tenderness in the lumbar spine there is some tenderness around the PSIS bilaterally. Negative straight leg raising bilaterally No pain with hip range of motion. Ambulates without assistive device no evidence of toe drag or knee instability  Skin:    General: Skin is warm and dry.  Neurological:     Mental Status: He is alert and oriented to person, place, and time.  Psychiatric:        Mood and Affect: Mood normal.        Behavior: Behavior normal.   Motor strength is 5/5 bilateral hip flexor knee extensor ankle dorsiflexor.  Sensation normal in lower extremities.       Assessment & Plan:  1.  History of lumbar spinal stenosis fairly mild multilevel disc degeneration.  Overall doing well from a functional standpoint he is working full-time at a fairly physical job.  We discussed the need to do some stretching in the morning have printed out some exercises for him.  Also we addressed his sleep issues partially due to change in work shift will give Flexeril 10 mg nightly as needed Continue tramadol 100 mg 4 times daily Follow-up in 6 months Will need UDS at that time Went over numerous complaints went over MRI scan Moderate decision making, do not need new imaging studies at this  time

## 2021-12-12 ENCOUNTER — Ambulatory Visit: Payer: BC Managed Care – PPO | Admitting: Family Medicine

## 2021-12-12 ENCOUNTER — Encounter: Payer: Self-pay | Admitting: Family Medicine

## 2021-12-12 NOTE — Progress Notes (Deleted)
Logan Young is a 41 y.o. male presents to office today for annual physical exam examination.    Concerns today include: 1. ***  Occupation: ***, Marital status: ***, Substance use: *** Diet: ***, Exercise: *** Last eye exam: *** Last dental exam: *** Last colonoscopy: *** Last mammogram: *** Last pap smear: *** Refills needed today: *** Immunizations needed:  There is no immunization history on file for this patient.   Past Medical History:  Diagnosis Date   Anxiety 09/10/2021   Chronic low back pain    Depression    Insomnia    Migraines    Social History   Socioeconomic History   Marital status: Legally Separated    Spouse name: Not on file   Number of children: Not on file   Years of education: Not on file   Highest education level: Not on file  Occupational History   Not on file  Tobacco Use   Smoking status: Every Day    Packs/day: 1.00    Years: 12.00    Pack years: 12.00    Types: Cigarettes   Smokeless tobacco: Never  Vaping Use   Vaping Use: Never used  Substance and Sexual Activity   Alcohol use: Yes    Comment: rare   Drug use: No   Sexual activity: Not on file  Other Topics Concern   Not on file  Social History Narrative   Not on file   Social Determinants of Health   Financial Resource Strain: Not on file  Food Insecurity: Not on file  Transportation Needs: Not on file  Physical Activity: Not on file  Stress: Not on file  Social Connections: Not on file  Intimate Partner Violence: Not on file   Past Surgical History:  Procedure Laterality Date   APPENDECTOMY  AGE 22   CHOLECYSTECTOMY  12/2013   Family History  Problem Relation Age of Onset   Diabetes Mother    Cancer Mother        rectal   Lupus Father    Cancer Father        stomach    Current Outpatient Medications:    cyclobenzaprine (FLEXERIL) 10 MG tablet, Take 1 tablet (10 mg total) by mouth at bedtime., Disp: 30 tablet, Rfl: 0   DULoxetine (CYMBALTA) 60 MG  capsule, Take 1 capsule (60 mg total) by mouth daily., Disp: 60 capsule, Rfl: 0   gabapentin (NEURONTIN) 100 MG capsule, Take 100 mg by mouth 2 (two) times daily., Disp: , Rfl:    meloxicam (MOBIC) 15 MG tablet, Take 15 mg by mouth daily. (Patient not taking: Reported on 09/10/2021), Disp: , Rfl:    Rimegepant Sulfate (NURTEC) 75 MG TBDP, 75 mg po every other day, do not exceed 75 mg per 24 hr (Patient not taking: Reported on 09/10/2021), Disp: 30 tablet, Rfl: 2   traMADol (ULTRAM) 50 MG tablet, Take 2 tablets (100 mg total) by mouth 4 (four) times daily., Disp: 240 tablet, Rfl: 5  Allergies  Allergen Reactions   Sulfa Antibiotics Itching     ROS: Review of Systems {ros; complete:30496}    Physical exam {Exam, Complete:[id number]}    Assessment/ Plan: [facility] here for annual physical exam.   No problem-specific Assessment & Plan notes found for this encounter.   Counseled on healthy lifestyle choices, including diet (rich in fruits, vegetables and lean meats and low in salt and simple carbohydrates) and exercise (at least 30 minutes of moderate physical activity daily).  Patient to  follow up in 1 year for annual exam or sooner if needed.  Justina Bertini M. Nadine Counts, DO

## 2021-12-12 NOTE — Patient Instructions (Incomplete)
Do not forget to come in for your 8am labs.  This will be testing testosterone, cholesterol, sugar etc.  Preventive Care 58-41 Years Old, Male Preventive care refers to lifestyle choices and visits with your health care provider that can promote health and wellness. Preventive care visits are also called wellness exams. What can I expect for my preventive care visit? Counseling During your preventive care visit, your health care provider may ask about your: Medical history, including: Past medical problems. Family medical history. Current health, including: Emotional well-being. Home life and relationship well-being. Sexual activity. Lifestyle, including: Alcohol, nicotine or tobacco, and drug use. Access to firearms. Diet, exercise, and sleep habits. Safety issues such as seatbelt and bike helmet use. Sunscreen use. Work and work Statistician. Physical exam Your health care provider will check your: Height and weight. These may be used to calculate your BMI (body mass index). BMI is a measurement that tells if you are at a healthy weight. Waist circumference. This measures the distance around your waistline. This measurement also tells if you are at a healthy weight and may help predict your risk of certain diseases, such as type 2 diabetes and high blood pressure. Heart rate and blood pressure. Body temperature. Skin for abnormal spots. What immunizations do I need? Vaccines are usually given at various ages, according to a schedule. Your health care provider will recommend vaccines for you based on your age, medical history, and lifestyle or other factors, such as travel or where you work. What tests do I need? Screening Your health care provider may recommend screening tests for certain conditions. This may include: Lipid and cholesterol levels. Diabetes screening. This is done by checking your blood sugar (glucose) after you have not eaten for a while (fasting). Hepatitis B  test. Hepatitis C test. HIV (human immunodeficiency virus) test. STI (sexually transmitted infection) testing, if you are at risk. Lung cancer screening. Prostate cancer screening. Colorectal cancer screening. Talk with your health care provider about your test results, treatment options, and if necessary, the need for more tests. Follow these instructions at home: Eating and drinking  Eat a diet that includes fresh fruits and vegetables, whole grains, lean protein, and low-fat dairy products. Take vitamin and mineral supplements as recommended by your health care provider. Do not drink alcohol if your health care provider tells you not to drink. If you drink alcohol: Limit how much you have to 0-2 drinks a day. Know how much alcohol is in your drink. In the [place] one drink equals one 12 oz bottle of beer (355 mL), one 5 oz glass of wine (148 mL), or one 1 oz glass of hard liquor (44 mL). Lifestyle Brush your teeth every morning and night with fluoride toothpaste. Floss one time each day. Exercise for at least 30 minutes 5 or more days each week. Do not use any products that contain nicotine or tobacco. These products include cigarettes, chewing tobacco, and vaping devices, such as e-cigarettes. If you need help quitting, ask your health care provider. Do not use drugs. If you are sexually active, practice safe sex. Use a condom or other form of protection to prevent STIs. Take aspirin only as told by your health care provider. Make sure that you understand how much to take and what form to take. Work with your health care provider to find out whether it is safe and beneficial for you to take aspirin daily. Find healthy ways to manage stress, such as: Meditation, yoga, or listening to music.  Journaling. Talking to a trusted person. Spending time with friends and family. Minimize exposure to UV radiation to reduce your risk of skin cancer. Safety Always wear your seat belt while  driving or riding in a vehicle. Do not drive: If you have been drinking alcohol. Do not ride with someone who has been drinking. When you are tired or distracted. While texting. If you have been using any mind-altering substances or drugs. Wear a helmet and other protective equipment during sports activities. If you have firearms in your house, make sure you follow all gun safety procedures. What's next? Go to your health care provider once a year for an annual wellness visit. Ask your health care provider how often you should have your eyes and teeth checked. Stay up to date on all vaccines. This information is not intended to replace advice given to you by your health care provider. Make sure you discuss any questions you have with your health care provider. Document Revised: 04/02/2021 Document Reviewed: 04/02/2021 Elsevier Patient Education  [place].

## 2022-01-15 ENCOUNTER — Encounter: Payer: BC Managed Care – PPO | Admitting: Family Medicine

## 2022-01-29 ENCOUNTER — Encounter: Payer: BC Managed Care – PPO | Admitting: Family Medicine

## 2022-03-13 ENCOUNTER — Encounter
Payer: BC Managed Care – PPO | Attending: Physical Medicine & Rehabilitation | Admitting: Physical Medicine & Rehabilitation

## 2022-04-26 ENCOUNTER — Other Ambulatory Visit: Payer: Self-pay | Admitting: Physical Medicine & Rehabilitation

## 2022-05-01 ENCOUNTER — Encounter: Payer: Self-pay | Admitting: Registered Nurse

## 2022-05-01 ENCOUNTER — Encounter: Payer: BC Managed Care – PPO | Attending: Physical Medicine & Rehabilitation | Admitting: Registered Nurse

## 2022-05-01 VITALS — Ht 72.0 in | Wt 200.0 lb

## 2022-05-01 DIAGNOSIS — Z5181 Encounter for therapeutic drug level monitoring: Secondary | ICD-10-CM | POA: Insufficient documentation

## 2022-05-01 DIAGNOSIS — M5136 Other intervertebral disc degeneration, lumbar region: Secondary | ICD-10-CM | POA: Diagnosis not present

## 2022-05-01 DIAGNOSIS — M47816 Spondylosis without myelopathy or radiculopathy, lumbar region: Secondary | ICD-10-CM | POA: Insufficient documentation

## 2022-05-01 DIAGNOSIS — G894 Chronic pain syndrome: Secondary | ICD-10-CM | POA: Insufficient documentation

## 2022-05-01 DIAGNOSIS — Z79899 Other long term (current) drug therapy: Secondary | ICD-10-CM | POA: Insufficient documentation

## 2022-05-01 MED ORDER — TRAMADOL HCL 50 MG PO TABS: 100.0000 mg | ORAL_TABLET | Freq: Four times a day (QID) | ORAL | 5 refills | Status: DC

## 2022-05-01 NOTE — Progress Notes (Signed)
Subjective:    Patient ID: Logan Young, male    DOB: 12-10-1980, 41 y.o.   MRN: [id number]  HPI: Logan Young is a 41 y.o. male who is scheduled for a My-Chart Video Visit, we have  discussed the limitations of evaluation and management by telemedicine and the availability of in person appointments. The patient expressed understanding and agreed to proceed.  He states his  pain is located in his neck and lower back pain. He rates his pain 10. His current exercise regime is walking  , he was encouraged to increase his HEP .   Logan Young Morphine equivalent is 40.00  MME.      Pain Inventory Average Pain 9 Pain Right Now 10 My pain is constant, dull, and aching  In the last 24 hours, has pain interfered with the following? General activity 10 Relation with others 10 Enjoyment of life 10 What TIME of day is your pain at its worst? morning  and night Sleep (in general) Poor  Pain is worse with: sitting and laying Pain improves with: medication Relief from Meds: 5  Family History  Problem Relation Age of Onset   Diabetes Mother    Cancer Mother        rectal   Lupus Father    Cancer Father        stomach   Social History   Socioeconomic History   Marital status: Legally Separated    Spouse name: Not on file   Number of children: Not on file   Years of education: Not on file   Highest education level: Not on file  Occupational History   Not on file  Tobacco Use   Smoking status: Every Day    Packs/day: 1.00    Years: 12.00    Total pack years: 12.00    Types: Cigarettes   Smokeless tobacco: Never  Vaping Use   Vaping Use: Never used  Substance and Sexual Activity   Alcohol use: Yes    Comment: rare   Drug use: No   Sexual activity: Not on file  Other Topics Concern   Not on file  Social History Narrative   Not on file   Social Determinants of Health   Financial Resource Strain: Not on file  Food Insecurity: Not on file  Transportation Needs: Not on file   Physical Activity: Not on file  Stress: Not on file  Social Connections: Not on file   Past Surgical History:  Procedure Laterality Date   APPENDECTOMY  AGE 41   CHOLECYSTECTOMY  12/2013   Past Surgical History:  Procedure Laterality Date   APPENDECTOMY  AGE 41   CHOLECYSTECTOMY  12/2013   Past Medical History:  Diagnosis Date   Anxiety 09/10/2021   Chronic low back pain    Depression    Insomnia    Migraines    Ht 6' (1.829 m)   Wt 200 lb (90.7 kg)   BMI 27.12 kg/m   Opioid Risk Score:   Fall Risk Score:  `1  Depression screen PHQ 2/9     10/31/2021    1:58 PM 10/28/2021    2:21 PM 09/10/2021   10:37 AM 11/19/2020    9:01 AM 08/23/2020   10:16 AM 08/06/2020   11:24 AM 02/13/2020   10:01 AM  Depression screen PHQ 2/9  Decreased Interest 0 3 3 0 0 3 1  Down, Depressed, Hopeless 3 3 3  0 0 3 2  PHQ - 2 Score 3  6 6 0 0 6 3  Altered sleeping  3 3   3 3   Tired, decreased energy  3 3   3 3   Change in appetite  0 1   0 1  Feeling bad or failure about yourself   1 3   3 1   Trouble concentrating  3 3   3 2   Moving slowly or fidgety/restless  0 0   0 1  Suicidal thoughts  0 1   3 2   PHQ-9 Score  16 20   21 16   Difficult doing work/chores  Extremely dIfficult Extremely dIfficult   Extremely dIfficult Extremely dIfficult      Review of Systems  Musculoskeletal:  Positive for back pain.       Leg cramps  Neurological:  Positive for weakness.  All other systems reviewed and are negative.     Objective:   Physical Exam Vitals and nursing note reviewed.  Musculoskeletal:     Comments: No Physical Exam Performed : My Chart Video Visit           Assessment & Plan:  Lumbar Spondylosis: Lumbar Degenerative Disc Disease: Encouraged to increase his HEP as Tolerated. Continue to Monitor.  Chronic Pain Syndrome: Refill: Tramadol 50 mg two tablets 4 times a day as needed for pain #240. We will continue the opioid monitoring program, this consists of regular clinic visits,  examinations, urine drug screen, pill counts as well as use of Controlled Substance Reporting system. A 12 month History has been reviewed on the Controlled Substance Reporting System on 05/01/2022  F/U in 6 months  My Chart Video Visit Established Patient Location of Patient: In his Home Location of Provider: In her Home F/U in 6 Months

## 2022-05-05 ENCOUNTER — Other Ambulatory Visit: Payer: Self-pay | Admitting: Physical Medicine & Rehabilitation

## 2022-05-21 ENCOUNTER — Encounter
Payer: BC Managed Care – PPO | Attending: Physical Medicine & Rehabilitation | Admitting: Physical Medicine & Rehabilitation

## 2022-05-21 ENCOUNTER — Encounter: Payer: Self-pay | Admitting: Physical Medicine & Rehabilitation

## 2022-05-21 VITALS — BP 132/87 | HR 77 | Ht 72.0 in | Wt 209.8 lb

## 2022-05-21 DIAGNOSIS — M5030 Other cervical disc degeneration, unspecified cervical region: Secondary | ICD-10-CM | POA: Diagnosis not present

## 2022-05-21 DIAGNOSIS — M5136 Other intervertebral disc degeneration, lumbar region: Secondary | ICD-10-CM | POA: Diagnosis not present

## 2022-05-21 NOTE — Patient Instructions (Signed)
Referral to [facility] PT for dry needling and cervical  traction trial

## 2022-05-21 NOTE — Progress Notes (Signed)
Subjective:    Patient ID: Logan Young, male    DOB: [birth year], 41 y.o.   MRN: [id number]  HPI 41 year old male with chronic low back as well as neck pain.  Continues to work full-time.  He is modified independent with all self-care and mobility. The patient has no pain radiating down his arm or his leg.  His pain is mainly positional with forward flexion of both the cervical spine as well as the lumbar spine.  Has tried chiropractic care which gave him some very temporary relief.  He feels very tight in the muscles around his neck. Exercising with light dumbells , also treadmill  Heat used when back is flared up Usually uses ice  MRI LUMBAR SPINE WITHOUT CONTRAST   TECHNIQUE: Multiplanar, multisequence MR imaging of the lumbar spine was performed. No intravenous contrast was administered.   COMPARISON:  MRI of the lumbar spine May 01, 2015.   FINDINGS: Segmentation:  Standard.   Alignment:  Physiologic.   Vertebrae: No fracture, evidence of discitis, or bone lesion. Endplate degenerative changes at L5-S1, developed since prior MRI.   Conus medullaris and cauda equina: Conus extends to the T12 level. Conus and cauda equina appear normal.   Paraspinal and other soft tissues: Negative.   Disc levels:   T12-L1: No spinal canal or neural foraminal stenosis.   L1-2: No spinal canal or neural foraminal stenosis.   L2-3: Shallow disc bulge without significant spinal canal or neural foraminal stenosis.   L3-4: Central disc protrusion causing indentation of the thecal sac which in association with mild facet degenerative changes and ligamentum flavum redundancy resulting in mild narrowing of the thecal sac. No significant neural foraminal stenosis. This has developed since prior MRI.   L4-5: Right asymmetric disc bulge, mild facet degenerative changes and ligamentum flavum redundancy with small joint effusion on the left. Findings result in mild spinal canal stenosis and  mild right neural foraminal narrowing. This has been minimally progressed from prior MRI.   L5-S1: Loss of disc height, disc bulge with superimposed right central disc protrusion causing effacement of the subarticular zone with posterior displacement of the traversing right S1 nerve root. There is also mild facet degenerative changes ligamentum flavum redundancy. Findings result in mild spinal canal stenosis and mild bilateral neural foraminal narrowing. The disc protrusion with has progressed since prior MRI.   IMPRESSION: 1. Interval progression of degenerative disc disease at L5-S1 with right central disc protrusion causing effacement of the subarticular zone with posterior displacement of the traversing right S1 nerve root. 2. Mild spinal canal stenosis at L3-L4, L4-L5 and L5-S1. 3. Mild right neural foraminal narrowing at L4-L5 and bilateral neural foraminal narrowing at L5-S1.     Electronically Signed   By: Baldemar Lenis M.D.   On: 03/13/2020 13:48   Pain Inventory Average Pain 8 Pain Right Now 7 My pain is constant, dull, and tingling  In the last 24 hours, has pain interfered with the following? General activity 10 Relation with others 10 Enjoyment of life 10 What TIME of day is your pain at its worst? morning  and night Sleep (in general) Poor  Pain is worse with: walking and inactivity Pain improves with: pacing activities and medication Relief from Meds: 6  Family History  Problem Relation Age of Onset   Diabetes Mother    Cancer Mother        rectal   Lupus Father    Cancer Father  stomach   Social History   Socioeconomic History   Marital status: Legally Separated    Spouse name: Not on file   Number of children: Not on file   Years of education: Not on file   Highest education level: Not on file  Occupational History   Not on file  Tobacco Use   Smoking status: Every Day    Packs/day: 1.00    Years: 12.00    Total  pack years: 12.00    Types: Cigarettes   Smokeless tobacco: Never  Vaping Use   Vaping Use: Never used  Substance and Sexual Activity   Alcohol use: Yes    Comment: rare   Drug use: No   Sexual activity: Not on file  Other Topics Concern   Not on file  Social History Narrative   Not on file   Social Determinants of Health   Financial Resource Strain: Not on file  Food Insecurity: Not on file  Transportation Needs: Not on file  Physical Activity: Not on file  Stress: Not on file  Social Connections: Not on file   Past Surgical History:  Procedure Laterality Date   APPENDECTOMY  AGE 47   CHOLECYSTECTOMY  12/2013   Past Surgical History:  Procedure Laterality Date   APPENDECTOMY  AGE 47   CHOLECYSTECTOMY  12/2013   Past Medical History:  Diagnosis Date   Anxiety 09/10/2021   Chronic low back pain    Depression    Insomnia    Migraines    BP 132/87   Pulse 77   Ht 6' (1.829 m)   Wt 209 lb 12.8 oz (95.2 kg)   SpO2 97%   BMI 28.45 kg/m   Opioid Risk Score:   Fall Risk Score:  `1  Depression screen [facility] 2/9     05/21/2022   10:49 AM 10/31/2021    1:58 PM 10/28/2021    2:21 PM 09/10/2021   10:37 AM 11/19/2020    9:01 AM 08/23/2020   10:16 AM 08/06/2020   11:24 AM  Depression screen PHQ 2/9  Decreased Interest 0 0 3 3 0 0 3  Down, Depressed, Hopeless 0 3 3 3  0 0 3  PHQ - 2 Score 0 3 6 6  0 0 6  Altered sleeping   3 3   3   Tired, decreased energy   3 3   3   Change in appetite   0 1   0  Feeling bad or failure about yourself    1 3   3   Trouble concentrating   3 3   3   Moving slowly or fidgety/restless   0 0   0  Suicidal thoughts   0 1   3  PHQ-9 Score   16 20   21   Difficult doing work/chores   Extremely dIfficult Extremely dIfficult   Extremely dIfficult     Review of Systems  Constitutional: Negative.   HENT: Negative.    Eyes: Negative.   Respiratory: Negative.    Cardiovascular: Negative.   Gastrointestinal: Negative.   Endocrine: Negative.    Genitourinary: Negative.   Musculoskeletal:  Positive for back pain, neck pain and neck stiffness.  Skin: Negative.   Allergic/Immunologic: Negative.   Neurological: Negative.   Hematological: Negative.   Psychiatric/Behavioral:  Positive for sleep disturbance.       Objective:   Physical Exam Vitals and nursing note reviewed.  Constitutional:      Appearance: He is normal weight.  HENT:  Head: Normocephalic and atraumatic.  Eyes:     Extraocular Movements: Extraocular movements intact.     Conjunctiva/sclera: Conjunctivae normal.     Pupils: Pupils are equal, round, and reactive to light.  Musculoskeletal:     Comments: Patient has no tenderness palpation lumbar paraspinals there is some tenderness to palpation bilateral upper trapezius area Upper extremity range of motion is normal Lower extremity range of motion is normal Gait without assist device no evidence of toe drag or knee instability  Skin:    General: Skin is warm and dry.  Neurological:     Mental Status: He is alert and oriented to person, place, and time.  Psychiatric:        Mood and Affect: Mood normal.        Behavior: Behavior normal.           Assessment & Plan:  1.  Chronic low back pain lumbar degenerative disc predominant primarily L5-S1 no radicular symptoms at this time. 2.  Cervicalgia likely has cervical degenerative disc but also has some myofascial pain.  Will refer to PT they can trial dry needling, therapeutic exercise as well as cervical traction. In terms of medication management continue tramadol 100 mg 4 times daily Follow-up in 6 months PDMP reviewed Will need UDS at next visit

## 2022-06-24 ENCOUNTER — Encounter (HOSPITAL_COMMUNITY): Payer: Self-pay | Admitting: Physical Therapy

## 2022-06-24 ENCOUNTER — Ambulatory Visit (HOSPITAL_COMMUNITY): Payer: BC Managed Care – PPO | Attending: Family Medicine | Admitting: Physical Therapy

## 2022-06-24 DIAGNOSIS — M542 Cervicalgia: Secondary | ICD-10-CM | POA: Insufficient documentation

## 2022-06-24 DIAGNOSIS — M5459 Other low back pain: Secondary | ICD-10-CM | POA: Insufficient documentation

## 2022-06-24 DIAGNOSIS — R2689 Other abnormalities of gait and mobility: Secondary | ICD-10-CM | POA: Insufficient documentation

## 2022-06-24 DIAGNOSIS — R29898 Other symptoms and signs involving the musculoskeletal system: Secondary | ICD-10-CM | POA: Insufficient documentation

## 2022-06-24 DIAGNOSIS — M5030 Other cervical disc degeneration, unspecified cervical region: Secondary | ICD-10-CM | POA: Diagnosis not present

## 2022-06-24 DIAGNOSIS — M5136 Other intervertebral disc degeneration, lumbar region: Secondary | ICD-10-CM | POA: Diagnosis not present

## 2022-06-24 DIAGNOSIS — M6281 Muscle weakness (generalized): Secondary | ICD-10-CM | POA: Diagnosis present

## 2022-06-24 NOTE — Therapy (Signed)
OUTPATIENT PHYSICAL THERAPY THORACOLUMBAR EVALUATION   Patient Name: Logan Young MRN: [id number] DOB:08/12/1981, 41 y.o., male Today's Date: 06/24/2022   PT End of Session - 06/24/22 1424     Visit Number 1    Number of Visits 8    Date for PT Re-Evaluation 08/19/22    Authorization Type BCBS ( no uath, visit limit 60)    PT Start Time 1428    PT Stop Time 1513    PT Time Calculation (min) 45 min    Activity Tolerance Patient tolerated treatment well    Behavior During Therapy [facility] for tasks assessed/performed             Past Medical History:  Diagnosis Date   Anxiety 09/10/2021   Chronic low back pain    Depression    Insomnia    Migraines    Past Surgical History:  Procedure Laterality Date   APPENDECTOMY  AGE 20   CHOLECYSTECTOMY  12/2013   Patient Active Problem List   Diagnosis Date Noted   Anxiety 09/10/2021   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   Current smoker 01/31/2019   Chronic diarrhea 07/22/2017   Left-sided low back pain without sciatica 06/12/2015   Depression    Migraines    Insomnia    Chronic low back pain     PCP: Delynn Flavin DO  REFERRING PROVIDER: Erick Colace, MD   REFERRING DIAG: M51.36 (ICD-10-CM) - Lumbar degenerative disc disease M50.30 (ICD-10-CM) - Degeneration of cervical intervertebral disc   Rationale for Evaluation and Treatment Rehabilitation  THERAPY DIAG:  Other low back pain  Cervicalgia  Muscle weakness (generalized)  Other abnormalities of gait and mobility  Other symptoms and signs involving the musculoskeletal system  ONSET DATE: 7 years  SUBJECTIVE:                                                                                                                                                                                           SUBJECTIVE STATEMENT: Patient states he injured his back at work about 7 years ago when lifting. He felt something in his back pop. He had 2 bulging disks in the low back  and 3 in the neck. He takes tramadol with some relief of symptoms while at work. He had injections, PT, chiro with minimal relief. Symptoms improve with moving. Worse with laying down. Back bothers him more. Neck feels really stiff.   PERTINENT HISTORY:  Chronic back pain, hx depression, insomnia, migraines  PAIN:  Are you having pain? Yes: NPRS scale: current 0/10; worst 6/10 Pain location: low back  Pain description: aching Aggravating factors: rest Relieving factors:  movement   PRECAUTIONS: None  WEIGHT BEARING RESTRICTIONS No  FALLS:  Has patient fallen in last 6 months? No  LIVING ENVIRONMENT: Lives with: lives with their family and lives with their spouse Lives in: House/apartment Stairs: No Has following equipment at home: shower chair  OCCUPATION: Full time - Good year  PLOF: Independent  PATIENT GOALS decrease some pain   OBJECTIVE:   DIAGNOSTIC FINDINGS:  CT Lumbar spine 07/15/20 IMPRESSION: 1. No interval finding when compared to May 2021 MRI. 2. L5-S1 disc degeneration with right paracentral protrusion and buttressing osteophyte impinging on the right S1 nerve root. 3. L4-5 bilateral subarticular recess crowding.  PATIENT SURVEYS:  FOTO 53% function  SCREENING FOR RED FLAGS: Bowel or bladder incontinence: No Spinal tumors: No Cauda equina syndrome: No Compression fracture: No Abdominal aneurysm: No  COGNITION:  Overall cognitive status: Within functional limits for tasks assessed     SENSATION: WFL   POSTURE: rounded shoulders, forward head, and increased lumbar lordosis  PALPATION: Hypomobile thoracic and lumbar spine, no tenderness throughout lumbar, thoracic spine or hips  LUMBAR ROM:   Active  A/PROM  eval  Flexion 0% limited  Extension 25% limited  Right lateral flexion 25% limited  Left lateral flexion 25% limited  Right rotation 0% limited  Left rotation 0% limited   (Blank rows = not tested)  LOWER EXTREMITY ROM:    decreased hip extension ROM bilaterally  Active  Right eval Left eval  Hip flexion    Hip extension    Hip abduction    Hip adduction    Hip internal rotation    Hip external rotation    Knee flexion    Knee extension    Ankle dorsiflexion    Ankle plantarflexion    Ankle inversion    Ankle eversion     (Blank rows = not tested)  LOWER EXTREMITY MMT:    MMT Right eval Left eval  Hip flexion 5 5  Hip extension 4+ 4+  Hip abduction 4+ 4+  Hip adduction    Hip internal rotation    Hip external rotation    Knee flexion 5 5  Knee extension 5 5  Ankle dorsiflexion 5 5  Ankle plantarflexion    Ankle inversion    Ankle eversion     (Blank rows = not tested)     FUNCTIONAL TESTS:  2 minute walk test: 390 feet, no increase in symptoms Squat: trunk slightly flexed, cueing for wider BOS  GAIT: Distance walked: 390 feet Assistive device utilized: None Level of assistance: Complete Independence Comments:    TODAY'S TREATMENT  06/24/22 Prone press up 2 x 10  Bridge 2 x 10    PATIENT EDUCATION:  Education details: Patient educated on exam findings, POC, scope of PT, HEP, and cramping symptoms with possible hydration/nutrition component. Person educated: Patient Education method: Explanation, Demonstration, and Handouts Education comprehension: verbalized understanding, returned demonstration, verbal cues required, and tactile cues required   HOME EXERCISE PROGRAM: Access Code: [email or web address] Date: 06/24/2022 - Supine Bridge  - 3 x daily - 7 x weekly - 2 sets - 10 reps - Prone Press Up  - 3 x daily - 7 x weekly - 2 sets - 10 reps  ASSESSMENT:  CLINICAL IMPRESSION: Patient a 41 y.o. y.o. male who was seen today for physical therapy evaluation and treatment for low back and neck pain. Patient presents with pain limited deficits in lumbar and cervical spine strength, ROM, endurance, activity tolerance, and functional mobility  with ADL. Patient is having to modify  and restrict ADL as indicated by outcome measure score as well as subjective information and objective measures which is affecting overall participation. Patient will benefit from skilled physical therapy in order to improve function and reduce impairment.   OBJECTIVE IMPAIRMENTS decreased activity tolerance, decreased endurance, decreased mobility, difficulty walking, decreased ROM, decreased strength, hypomobility, increased muscle spasms, impaired flexibility, improper body mechanics, postural dysfunction, and pain.   ACTIVITY LIMITATIONS carrying, lifting, bending, standing, squatting, stairs, transfers, locomotion level, and caring for others  PARTICIPATION LIMITATIONS: meal prep, cleaning, laundry, driving, shopping, community activity, occupation, and yard work  PERSONAL FACTORS Time since onset of injury/illness/exacerbation and 3+ comorbidities: chronic back pain, hx depression, insomnia, migraines  are also affecting patient's functional outcome.   REHAB POTENTIAL: Good  CLINICAL DECISION MAKING: Stable/uncomplicated  EVALUATION COMPLEXITY: Low   GOALS: Goals reviewed with patient? Yes  SHORT TERM GOALS: Target date: 07/22/2022  Patient will be independent with HEP in order to improve functional outcomes. Baseline:  Goal status: INITIAL  2.  Patient will report at least 25% improvement in symptoms for improved quality of life. Baseline:  Goal status: INITIAL   LONG TERM GOALS: Target date: 08/19/2022  Patient will report at least 75% improvement in symptoms for improved quality of life. Baseline:  Goal status: INITIAL  2.  Patient will improve lumbar FOTO score by at least 9 points in order to indicate improved tolerance to activity. Baseline: 53% function Goal status: INITIAL  3.  Patient will demonstrate at least 25% improvement in lumbar ROM in all restricted planes for improved ability to move trunk while completing chores. Baseline: see AROM Goal status:  INITIAL  4.  Patient will demonstrate grade of 5/5 MMT grade in all tested musculature as evidence of improved strength to assist with stair ambulation and gait.   Baseline: see MMT Goal status: INITIAL    PLAN: PT FREQUENCY: 1x/week  PT DURATION: 8 weeks  PLANNED INTERVENTIONS: Therapeutic exercises, Therapeutic activity, Neuromuscular re-education, Balance training, Gait training, Patient/Family education, Joint manipulation, Joint mobilization, Stair training, Orthotic/Fit training, DME instructions, Aquatic Therapy, Dry Needling, Electrical stimulation, Spinal manipulation, Spinal mobilization, Cryotherapy, Moist heat, Compression bandaging, scar mobilization, Splintting, Taping, Traction, Ultrasound, Ionotophoresis 4mg /ml Dexamethasone, and Manual therapy  PLAN FOR NEXT SESSION: core and glute strength, lifting mechanics, postural strengthening; assess and treat cervical spine after lumbar region   , PT 06/24/2022, 3:16 PM

## 2022-07-01 IMAGING — CT CT L SPINE W/O CM
4 of 5 series · 13 of 33 positions shown, 15 images · non-contrast
Comparison: Lumbar MRI 03/13/2020

CLINICAL DATA: Mid to lower back pain with right leg pain. Cramping
in the legs

EXAM:
CT LUMBAR SPINE WITHOUT CONTRAST
TECHNIQUE: Multidetector CT imaging of the lumbar spine was performed without
intravenous contrast administration. Multiplanar CT image
reconstructions were also generated.

[Series 4: l-spine 2.00 br60 s3 lspine bone · axial · 0.35mm/px · z∈[+1368,+1430]mm · 2 of 124 slices shown]
[im 31/124  bone]
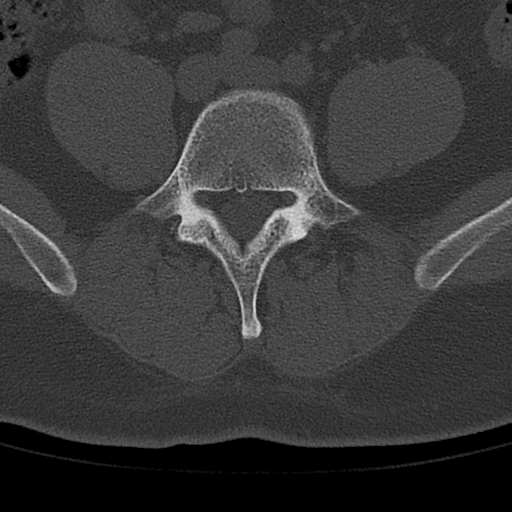
[im 62/124  bone]
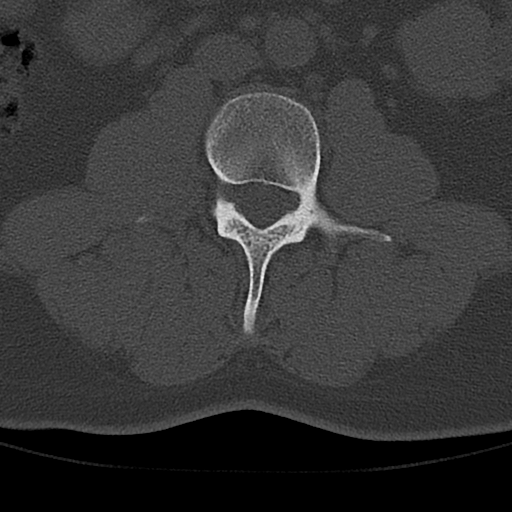

[Series 5: l-spine 2.00 br60 s3 sag bone · sagittal · 0.33mm/px · 5 of 81 slices shown, 6 images]
[im 27/81  bone]
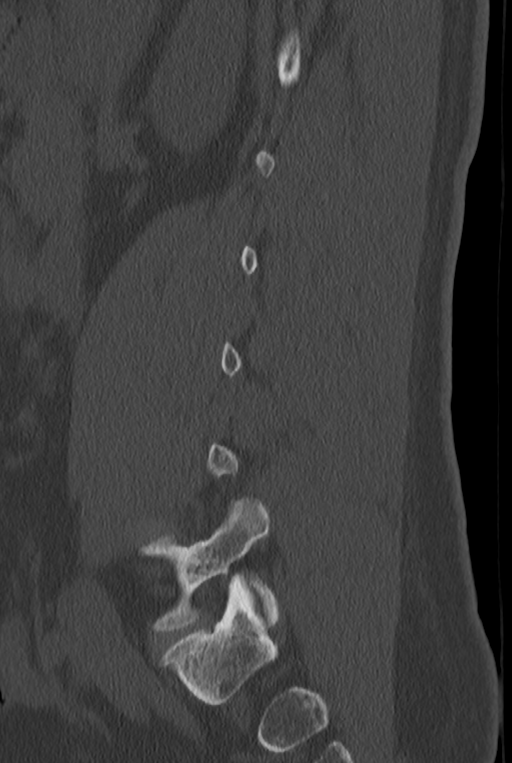
[im 34/81  bone]
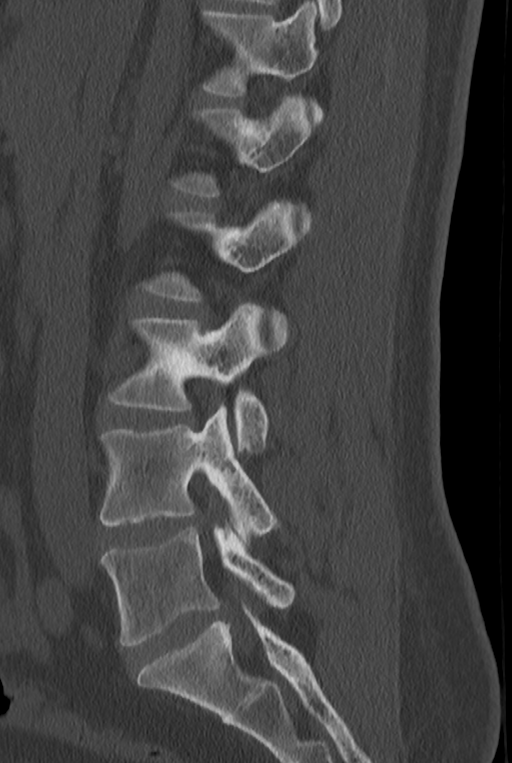
[im 41/81  soft-tissue]
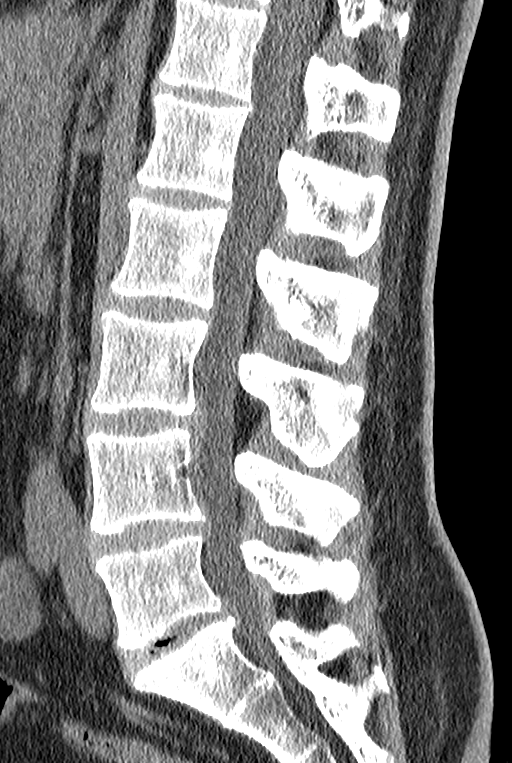
[im 41/81  bone]
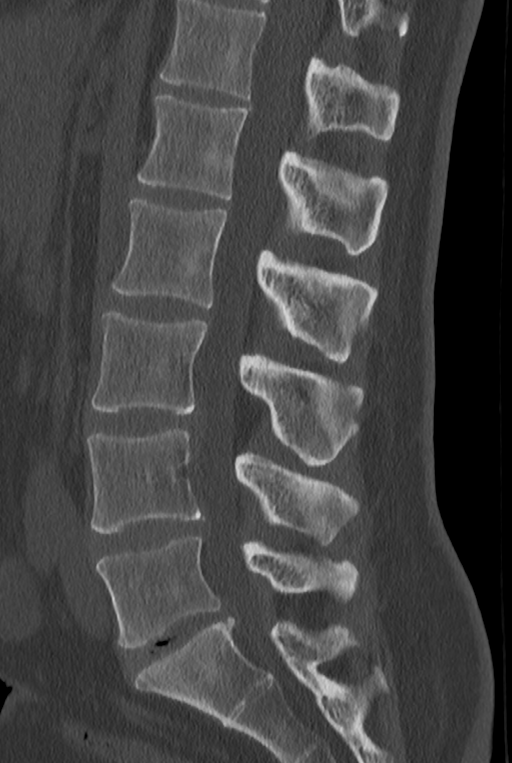
[im 47/81  bone]
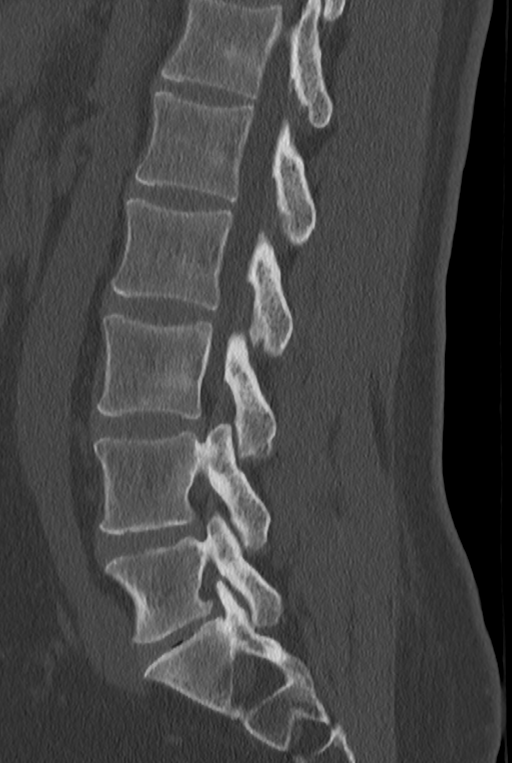
[im 54/81  bone]
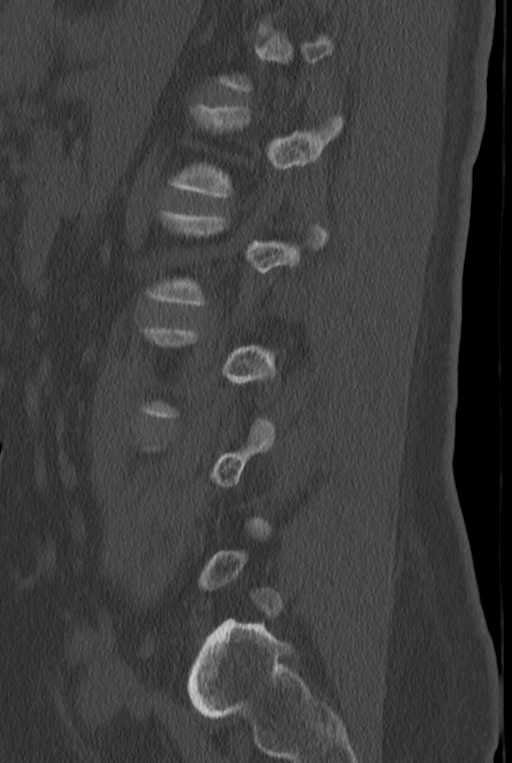

[Series 7: l-spine 2.00 br60 s3 cor bone · coronal · 0.32mm/px · 3 of 82 slices shown]
[im 17/82  bone]
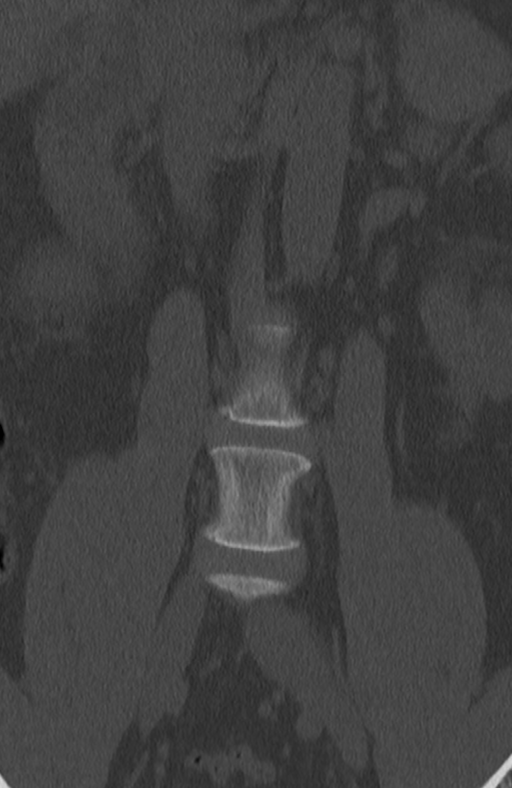
[im 33/82  bone]
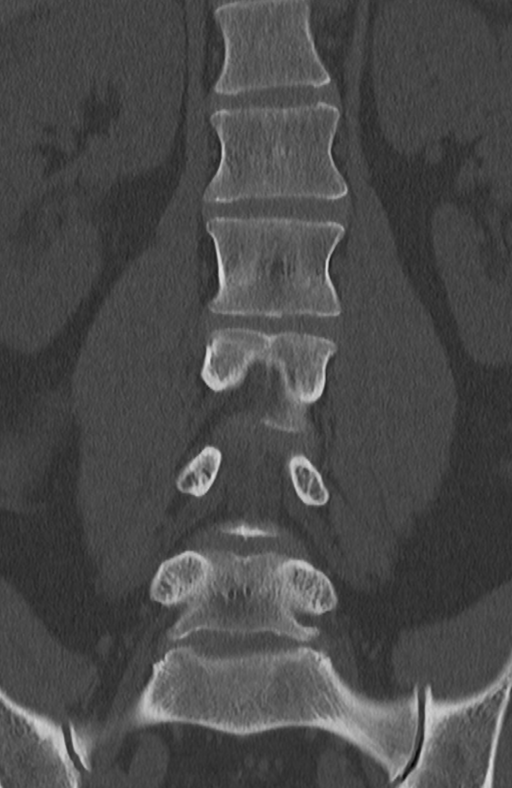
[im 49/82  bone]
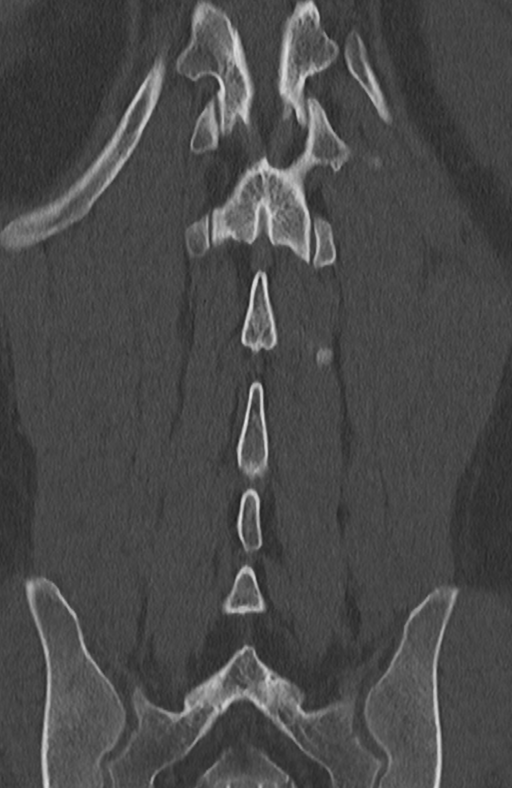

[Series 10: l-spine 2.00 br40 s3 true axials · axial · 0.31mm/px · z∈[+1359,+1503]mm · 3 of 124 slices shown, 4 images]
[im 31/124  soft-tissue]
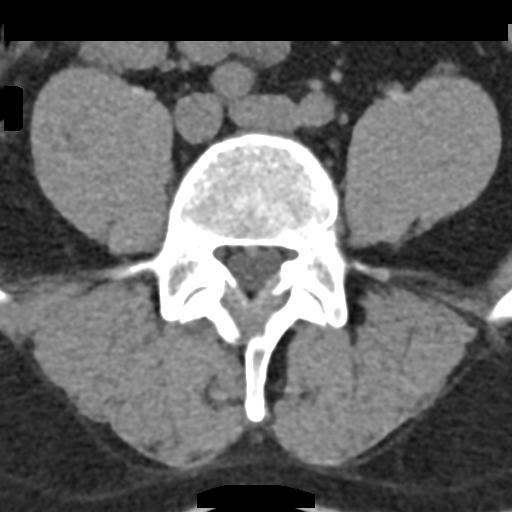
[im 31/124  bone]
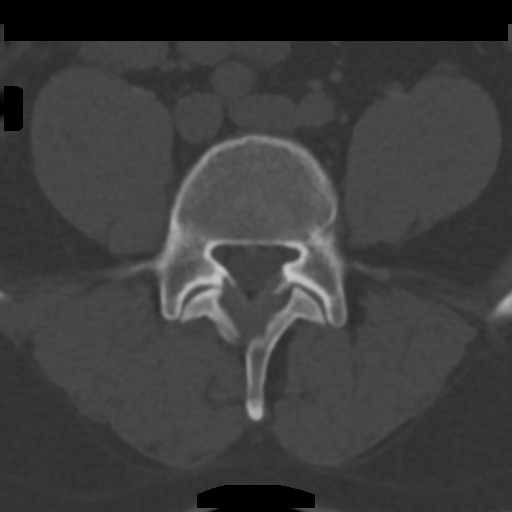
[im 62/124  bone]
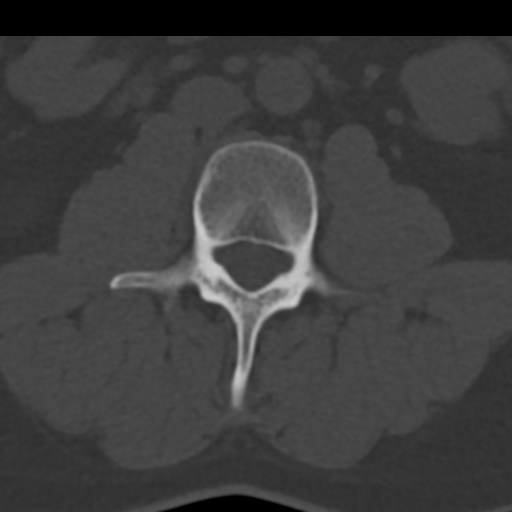
[im 93/124  bone]
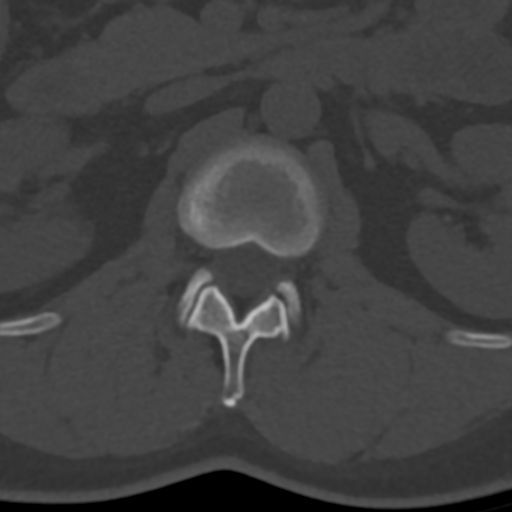

[13 of 33 positions shown; findings below may reference images not displayed]

FINDINGS: Segmentation: 5 lumbar type vertebrae

Alignment: Mild levocurvature.  No listhesis.

Vertebrae: No acute fracture or focal pathologic process.

Paraspinal and other soft tissues: Negative.

Disc levels:

T12- L1: Unremarkable.

L1-L2: Unremarkable.

L2-L3: Unremarkable.

L3-L4: Mild disc narrowing and bulging. Patent appearance of the
canal and foramina

L4-L5: Disc narrowing and bulging with ligamentum flavum thickening
and probable mild facet hypertrophy. Mild spinal stenosis with
bilateral subarticular recess crowding. The foramina appear patent

L5-S1:Greatest level of degenerative disc narrowing with right
paracentral protrusion and buttressing osteophyte impinging on the
right S1 nerve root. Spinal stenosis is mild-to-moderate at this
level. Mild bilateral foraminal narrowing.
IMPRESSION: 1. No interval finding when compared to February 2020 MRI.
2. L5-S1 disc degeneration with right paracentral protrusion and
buttressing osteophyte impinging on the right S1 nerve root.
3. L4-5 bilateral subarticular recess crowding.

## 2022-07-06 ENCOUNTER — Encounter: Payer: BC Managed Care – PPO | Admitting: Family Medicine

## 2022-07-10 ENCOUNTER — Encounter (HOSPITAL_COMMUNITY): Payer: BC Managed Care – PPO | Admitting: Physical Therapy

## 2022-07-13 ENCOUNTER — Encounter (HOSPITAL_COMMUNITY): Payer: BC Managed Care – PPO | Admitting: Physical Therapy

## 2022-07-13 NOTE — Telephone Encounter (Signed)
First no show:   Called pt about missed appointment.  Left message of next visit.  Rayetta Humphrey, [place] CLT [phone]

## 2022-07-19 DIAGNOSIS — R1010 Upper abdominal pain, unspecified: Secondary | ICD-10-CM | POA: Diagnosis not present

## 2022-07-19 DIAGNOSIS — R112 Nausea with vomiting, unspecified: Secondary | ICD-10-CM | POA: Diagnosis not present

## 2022-07-19 DIAGNOSIS — F17210 Nicotine dependence, cigarettes, uncomplicated: Secondary | ICD-10-CM | POA: Diagnosis not present

## 2022-07-19 DIAGNOSIS — Z882 Allergy status to sulfonamides status: Secondary | ICD-10-CM | POA: Diagnosis not present

## 2022-07-19 DIAGNOSIS — K2900 Acute gastritis without bleeding: Secondary | ICD-10-CM | POA: Diagnosis not present

## 2022-07-19 DIAGNOSIS — R6883 Chills (without fever): Secondary | ICD-10-CM | POA: Diagnosis not present

## 2022-07-21 ENCOUNTER — Ambulatory Visit (HOSPITAL_COMMUNITY): Payer: BC Managed Care – PPO | Attending: Family Medicine | Admitting: Physical Therapy

## 2022-07-21 DIAGNOSIS — M542 Cervicalgia: Secondary | ICD-10-CM | POA: Diagnosis not present

## 2022-07-21 DIAGNOSIS — M6281 Muscle weakness (generalized): Secondary | ICD-10-CM | POA: Insufficient documentation

## 2022-07-21 DIAGNOSIS — M5459 Other low back pain: Secondary | ICD-10-CM | POA: Insufficient documentation

## 2022-07-21 NOTE — Therapy (Signed)
OUTPATIENT PHYSICAL THERAPY Treatment  Patient Name: Logan Young MRN: [id number] DOB:Jul 01, 1981, 41 y.o., male Today's Date: 07/21/2022   PT End of Session - 07/21/22 1659     Visit Number 2    Number of Visits 8    Date for PT Re-Evaluation 08/19/22    Authorization Type [employer] ( no uath, visit limit 60)    PT Start Time 1655    PT Stop Time 1728    PT Time Calculation (min) 33 min    Activity Tolerance Patient tolerated treatment well    Behavior During Therapy [facility] for tasks assessed/performed              Past Medical History:  Diagnosis Date   Anxiety 09/10/2021   Chronic low back pain    Depression    Insomnia    Migraines    Past Surgical History:  Procedure Laterality Date   APPENDECTOMY  AGE 51   CHOLECYSTECTOMY  12/2013   Patient Active Problem List   Diagnosis Date Noted   Anxiety 09/10/2021   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   Current smoker 01/31/2019   Chronic diarrhea 07/22/2017   Left-sided low back pain without sciatica 06/12/2015   Depression    Migraines    Insomnia    Chronic low back pain     PCP: Delynn Flavin DO  REFERRING PROVIDER: Erick Colace, MD   REFERRING DIAG: M51.36 (ICD-10-CM) - Lumbar degenerative disc disease M50.30 (ICD-10-CM) - Degeneration of cervical intervertebral disc   Rationale for Evaluation and Treatment Rehabilitation  THERAPY DIAG:  Other low back pain  Cervicalgia  Muscle weakness (generalized)  ONSET DATE: 7 years  SUBJECTIVE:                                                                                                                                                                                           SUBJECTIVE STATEMENT: Pt late for appt. Reports he is having some cervical pain at 3/10 but currently without LBP.  Reports compliance with his HEP 1-2X day prior to return back to work, however after returning back to work last week admits to not doing his HEP.  Evaluation: Patient states  he injured his back at work about 7 years ago when lifting. He felt something in his back pop. He had 2 bulging disks in the low back and 3 in the neck. He takes tramadol with some relief of symptoms while at work. He had injections, PT, chiro with minimal relief. Symptoms improve with moving. Worse with laying down. Back bothers him more. Neck feels really stiff.   PERTINENT HISTORY:  Chronic back pain, hx  depression, insomnia, migraines  PAIN:  Are you having pain? Yes: NPRS scale: current 3/10; worst 6/10 Pain location: cervical Pain description: aching Aggravating factors: rest Relieving factors: movement   PRECAUTIONS: None  WEIGHT BEARING RESTRICTIONS No  FALLS:  Has patient fallen in last 6 months? No  LIVING ENVIRONMENT: Lives with: lives with their family and lives with their spouse Lives in: House/apartment Stairs: No Has following equipment at home: shower chair  OCCUPATION: Full time - Good year  PLOF: Independent  PATIENT GOALS decrease some pain   OBJECTIVE:   DIAGNOSTIC FINDINGS:  CT Lumbar spine 07/15/20 IMPRESSION: 1. No interval finding when compared to May 2021 MRI. 2. L5-S1 disc degeneration with right paracentral protrusion and buttressing osteophyte impinging on the right S1 nerve root. 3. L4-5 bilateral subarticular recess crowding.  PATIENT SURVEYS:  FOTO 53% function  SCREENING FOR RED FLAGS: Bowel or bladder incontinence: No Spinal tumors: No Cauda equina syndrome: No Compression fracture: No Abdominal aneurysm: No  COGNITION:  Overall cognitive status: Within functional limits for tasks assessed     SENSATION: WFL   POSTURE: rounded shoulders, forward head, and increased lumbar lordosis  PALPATION: Hypomobile thoracic and lumbar spine, no tenderness throughout lumbar, thoracic spine or hips  LUMBAR ROM:   Active  A/PROM  eval  Flexion 0% limited  Extension 25% limited  Right lateral flexion 25% limited  Left lateral  flexion 25% limited  Right rotation 0% limited  Left rotation 0% limited   (Blank rows = not tested)  LOWER EXTREMITY ROM:   decreased hip extension ROM bilaterally  LOWER EXTREMITY MMT:    MMT Right eval Left eval  Hip flexion 5 5  Hip extension 4+ 4+  Hip abduction 4+ 4+  Hip adduction    Hip internal rotation    Hip external rotation    Knee flexion 5 5  Knee extension 5 5  Ankle dorsiflexion 5 5  Ankle plantarflexion    Ankle inversion    Ankle eversion     (Blank rows = not tested)     FUNCTIONAL TESTS:  2 minute walk test: 390 feet, no increase in symptoms Squat: trunk slightly flexed, cueing for wider BOS  GAIT: Distance walked: 390 feet Assistive device utilized: None Level of assistance: Complete Independence Comments: 2MWT    TODAY'S TREATMENT  07/21/22 Goal and HEP review Supine:  Bridge 2X10  SLR bilaterally 10X each Prone press ups 2X10  Hip extensions 10X Seated:  sit to stands 10X no UE  Cervical 3D excursions Standing 3D hip exursions   06/24/22 Prone press up 2 x 10  Bridge 2 x 10    PATIENT EDUCATION:  Education details: Patient educated on exam findings, POC, scope of PT, HEP, and cramping symptoms with possible hydration/nutrition component. Person educated: Patient Education method: Explanation, Demonstration, and Handouts Education comprehension: verbalized understanding, returned demonstration, verbal cues required, and tactile cues required   HOME EXERCISE PROGRAM: Access Code: [email or web address] Date: 06/24/2022 - Supine Bridge  - 3 x daily - 7 x weekly - 2 sets - 10 reps - Prone Press Up  - 3 x daily - 7 x weekly - 2 sets - 10 reps   Date: 07/21/22:  3D hip exursions, 3D cervical excursions  ASSESSMENT:  CLINICAL IMPRESSION: Patient returns following 1 month since initial evaluation.  Pt reports no change in symptoms, however presents today with cervical pain and no LBP whereas at evaluation he had LBP and no cervical pain.  Pt  unable  to recall bridge as given for HEP. Reviewed goals and HEP with patient. Began additional LE strengthening exercises with cues for form and control of motion.  Cervical excursions added in seated to increase AROM of cervical and hip excursions in standing for lumbar.  Pt given these to add to HEP today as well .  Patient will benefit from skilled physical therapy in order to improve function and reduce impairment.   OBJECTIVE IMPAIRMENTS decreased activity tolerance, decreased endurance, decreased mobility, difficulty walking, decreased ROM, decreased strength, hypomobility, increased muscle spasms, impaired flexibility, improper body mechanics, postural dysfunction, and pain.   ACTIVITY LIMITATIONS carrying, lifting, bending, standing, squatting, stairs, transfers, locomotion level, and caring for others  PARTICIPATION LIMITATIONS: meal prep, cleaning, laundry, driving, shopping, community activity, occupation, and yard work  PERSONAL FACTORS Time since onset of injury/illness/exacerbation and 3+ comorbidities: chronic back pain, hx depression, insomnia, migraines  are also affecting patient's functional outcome.   REHAB POTENTIAL: Good  CLINICAL DECISION MAKING: Stable/uncomplicated  EVALUATION COMPLEXITY: Low   GOALS: Goals reviewed with patient? Yes  SHORT TERM GOALS: Target date: 07/22/2022  Patient will be independent with HEP in order to improve functional outcomes. Baseline:  Goal status: IN PROGRESS  2.  Patient will report at least 25% improvement in symptoms for improved quality of life. Baseline:  Goal status: IN PROGRESS   LONG TERM GOALS: Target date: 08/19/2022  Patient will report at least 75% improvement in symptoms for improved quality of life. Baseline:  Goal status: IN PROGRESS  2.  Patient will improve lumbar FOTO score by at least 9 points in order to indicate improved tolerance to activity. Baseline: 53% function Goal status: IN PROGRESS  3.   Patient will demonstrate at least 25% improvement in lumbar ROM in all restricted planes for improved ability to move trunk while completing chores. Baseline: see AROM Goal status: IN PROGRESS  4.  Patient will demonstrate grade of 5/5 MMT grade in all tested musculature as evidence of improved strength to assist with stair ambulation and gait.   Baseline: see MMT Goal status: IN PROGRESS    PLAN: PT FREQUENCY: 1x/week  PT DURATION: 8 weeks  PLANNED INTERVENTIONS: Therapeutic exercises, Therapeutic activity, Neuromuscular re-education, Balance training, Gait training, Patient/Family education, Joint manipulation, Joint mobilization, Stair training, Orthotic/Fit training, DME instructions, Aquatic Therapy, Dry Needling, Electrical stimulation, Spinal manipulation, Spinal mobilization, Cryotherapy, Moist heat, Compression bandaging, scar mobilization, Splintting, Taping, Traction, Ultrasound, Ionotophoresis 4mg /ml Dexamethasone, and Manual therapy  PLAN FOR NEXT SESSION: core and glute strength, lifting mechanics, postural strengthening; assess and treat cervical spine after lumbar region  , PTA/CLT [facility] Ph: [phone]  [phone], PTA 07/21/2022, 4:59 PM

## 2022-07-28 ENCOUNTER — Encounter (HOSPITAL_COMMUNITY): Payer: BC Managed Care – PPO

## 2022-07-29 DIAGNOSIS — S134XXA Sprain of ligaments of cervical spine, initial encounter: Secondary | ICD-10-CM | POA: Diagnosis not present

## 2022-07-29 DIAGNOSIS — S233XXA Sprain of ligaments of thoracic spine, initial encounter: Secondary | ICD-10-CM | POA: Diagnosis not present

## 2022-07-29 DIAGNOSIS — S338XXA Sprain of other parts of lumbar spine and pelvis, initial encounter: Secondary | ICD-10-CM | POA: Diagnosis not present

## 2022-08-03 DIAGNOSIS — S134XXA Sprain of ligaments of cervical spine, initial encounter: Secondary | ICD-10-CM | POA: Diagnosis not present

## 2022-08-03 DIAGNOSIS — S233XXA Sprain of ligaments of thoracic spine, initial encounter: Secondary | ICD-10-CM | POA: Diagnosis not present

## 2022-08-03 DIAGNOSIS — S338XXA Sprain of other parts of lumbar spine and pelvis, initial encounter: Secondary | ICD-10-CM | POA: Diagnosis not present

## 2022-08-04 ENCOUNTER — Encounter (HOSPITAL_COMMUNITY): Payer: BC Managed Care – PPO

## 2022-08-11 ENCOUNTER — Encounter (HOSPITAL_COMMUNITY): Payer: BC Managed Care – PPO | Admitting: Physical Therapy

## 2022-08-12 DIAGNOSIS — S338XXA Sprain of other parts of lumbar spine and pelvis, initial encounter: Secondary | ICD-10-CM | POA: Diagnosis not present

## 2022-08-12 DIAGNOSIS — S233XXA Sprain of ligaments of thoracic spine, initial encounter: Secondary | ICD-10-CM | POA: Diagnosis not present

## 2022-08-12 DIAGNOSIS — S134XXA Sprain of ligaments of cervical spine, initial encounter: Secondary | ICD-10-CM | POA: Diagnosis not present

## 2022-08-18 ENCOUNTER — Encounter (HOSPITAL_COMMUNITY): Payer: BC Managed Care – PPO | Admitting: Physical Therapy

## 2022-08-25 ENCOUNTER — Encounter (HOSPITAL_COMMUNITY): Payer: BC Managed Care – PPO | Admitting: Physical Therapy

## 2022-08-25 ENCOUNTER — Telehealth (HOSPITAL_COMMUNITY): Payer: Self-pay | Admitting: Physical Therapy

## 2022-08-25 NOTE — Telephone Encounter (Signed)
no show. no option to leave voicmail when provided number called.   Candie Mile, PT, DPT Physical Therapist [facility]

## 2022-09-01 ENCOUNTER — Telehealth (HOSPITAL_COMMUNITY): Payer: Self-pay | Admitting: Physical Therapy

## 2022-09-01 ENCOUNTER — Encounter (HOSPITAL_COMMUNITY): Payer: Self-pay | Admitting: Physical Therapy

## 2022-09-01 ENCOUNTER — Encounter (HOSPITAL_COMMUNITY): Payer: BC Managed Care – PPO | Admitting: Physical Therapy

## 2022-09-01 NOTE — Telephone Encounter (Signed)
3rd no show  Explained that this is his 3rd no show and we would be discharging him.  If he wants therapy in the future he will need to call his MD and have him write a new prescription.  Virgina Organ, PT CLT [phone]

## 2022-09-01 NOTE — Therapy (Unsigned)
[facility] [address] [place], [place], [address] Phone: [phone]   Fax:  [phone]  Patient Details  Name: Logan Young MRN: [id number] Date of Birth: 02/04/1981 Referring Provider:  No ref. provider found  Encounter Date: 09/01/2022  PHYSICAL THERAPY DISCHARGE SUMMARY  Visits from Start of Care: 2  Current functional level related to goals / functional outcomes: Unknown as pt has not returned   Remaining deficits: Unknown as pt has not returned   Education / Equipment: HEP   Patient agrees to discharge. Patient goals were  unknown . Patient is being discharged due to not returning since the last visit. 3 no shows , no show policy.  Virgina Organ, PT CLT [phone] 09/01/2022, 5:05 PM  [facility] [address] [place], [place], [address] Phone: [phone]   Fax:  [phone]

## 2022-09-08 ENCOUNTER — Encounter (HOSPITAL_COMMUNITY): Payer: BC Managed Care – PPO | Admitting: Physical Therapy

## 2022-09-16 ENCOUNTER — Encounter: Payer: BC Managed Care – PPO | Admitting: Family Medicine

## 2022-10-30 ENCOUNTER — Other Ambulatory Visit: Payer: Self-pay | Admitting: Registered Nurse

## 2022-10-30 NOTE — Telephone Encounter (Signed)
PMP was Reviewed.  My- Chart message sent to Logan Young to schedule February appointment.

## 2022-11-02 ENCOUNTER — Encounter: Payer: BC Managed Care – PPO | Admitting: Registered Nurse

## 2022-12-08 ENCOUNTER — Encounter: Payer: Self-pay | Admitting: Physical Medicine & Rehabilitation

## 2022-12-08 ENCOUNTER — Encounter
Payer: BC Managed Care – PPO | Attending: Physical Medicine & Rehabilitation | Admitting: Physical Medicine & Rehabilitation

## 2022-12-08 VITALS — BP 129/88 | HR 94 | Ht 72.0 in

## 2022-12-08 DIAGNOSIS — M542 Cervicalgia: Secondary | ICD-10-CM | POA: Diagnosis not present

## 2022-12-08 DIAGNOSIS — G894 Chronic pain syndrome: Secondary | ICD-10-CM | POA: Diagnosis not present

## 2022-12-08 DIAGNOSIS — Z79891 Long term (current) use of opiate analgesic: Secondary | ICD-10-CM | POA: Diagnosis not present

## 2022-12-08 DIAGNOSIS — Z5181 Encounter for therapeutic drug level monitoring: Secondary | ICD-10-CM | POA: Diagnosis not present

## 2022-12-08 MED ORDER — TRAMADOL HCL 50 MG PO TABS: 100.0000 mg | ORAL_TABLET | Freq: Four times a day (QID) | ORAL | 5 refills | Status: DC

## 2022-12-08 NOTE — Progress Notes (Signed)
Subjective:    Patient ID: Logan Young, male    DOB: [birth year], 42 y.o.   MRN: [id number]  HPI  CC:  Lumbar pain 42 year old male with history of lumbar degenerative disc and chronic low back pain.  His last MRI was in 2021 and this was reviewed this did show some potential for right S1 nerve root impingement however the patient does not have any clear-cut sciatic symptoms down the right lower extremity. Cramping in R>L leg , mostly occurs after sitting or laying down.  The patient showed me a video and it was more of a muscle twitch.  He admits to high caffeine intake and we discussed that this may be the more likely cause of this symptom. Neck pain with stiffness seen by chiropracter , adjustments were not helpful. Patient also saw physical therapy referred for his neck however treatment was for the low back because the patient states that was still his biggest issue.  This was not very helpful.  He does tolerate the tramadol 100 mg 4 times daily.  He takes Cymbalta as well but has had no signs of serotonin syndrome. Does stretching at home prior to going to work. Pain Inventory Average Pain 8 Pain Right Now 7 My pain is constant, dull, and tingling  In the last 24 hours, has pain interfered with the following? General activity 10 Relation with others 10 Enjoyment of life 10 What TIME of day is your pain at its worst? morning  and night Sleep (in general) Poor  Pain is worse with: walking and inactivity Pain improves with: pacing activities and medication Relief from Meds: 5  Family History  Problem Relation Age of Onset   Diabetes Mother    Cancer Mother        rectal   Lupus Father    Cancer Father        stomach   Social History   Socioeconomic History   Marital status: Legally Separated    Spouse name: Not on file   Number of children: Not on file   Years of education: Not on file   Highest education level: Not on file  Occupational History   Not on file   Tobacco Use   Smoking status: Every Day    Packs/day: 1.00    Years: 12.00    Total pack years: 12.00    Types: Cigarettes   Smokeless tobacco: Never  Vaping Use   Vaping Use: Never used  Substance and Sexual Activity   Alcohol use: Yes    Comment: rare   Drug use: No   Sexual activity: Not on file  Other Topics Concern   Not on file  Social History Narrative   Not on file   Social Determinants of Health   Financial Resource Strain: Not on file  Food Insecurity: Not on file  Transportation Needs: Not on file  Physical Activity: Not on file  Stress: Not on file  Social Connections: Not on file   Past Surgical History:  Procedure Laterality Date   APPENDECTOMY  AGE 50   CHOLECYSTECTOMY  12/2013   Past Surgical History:  Procedure Laterality Date   APPENDECTOMY  AGE 50   CHOLECYSTECTOMY  12/2013   Past Medical History:  Diagnosis Date   Anxiety 09/10/2021   Chronic low back pain    Depression    Insomnia    Migraines    BP 129/88   Pulse 94   Ht 6' (1.829 m)   SpO2 98%  BMI 28.45 kg/m   Opioid Risk Score:   Fall Risk Score:  `1  Depression screen PHQ 2/9     12/08/2022    3:24 PM 05/21/2022   10:49 AM 10/31/2021    1:58 PM 10/28/2021    2:21 PM 09/10/2021   10:37 AM 11/19/2020    9:01 AM 08/23/2020   10:16 AM  Depression screen PHQ 2/9  Decreased Interest 0 0 0 3 3 0 0  Down, Depressed, Hopeless 0 0 3 3 3 $ 0 0  PHQ - 2 Score 0 0 3 6 6 $ 0 0  Altered sleeping    3 3    Tired, decreased energy    3 3    Change in appetite    0 1    Feeling bad or failure about yourself     1 3    Trouble concentrating    3 3    Moving slowly or fidgety/restless    0 0    Suicidal thoughts    0 1    PHQ-9 Score    16 20    Difficult doing work/chores    Extremely dIfficult Extremely dIfficult       Review of Systems  Musculoskeletal:  Positive for back pain, gait problem and neck pain.  All other systems reviewed and are negative.     Objective:   Physical  Exam  General no acute distress Mood and affect are appropriate Cervical spine has 75% range with flexion extension lateral bending and rotation. Lumbar spine has 100% range of motion with flexion stent extension lateral bending and rotation. Negative straight leg raising bilaterally Motor strength is 5/5 bilateral deltoid, bicep, tricep, grip, hip flexor, knee extensor, ankle dorsiflexor and plantar flexor Ambulates without assist device no evidence of toe drag or knee instability Sensation is normal bilateral upper and lower limbs.  Tone is normal      Assessment & Plan:  #1.  Lumbar degenerative disc imaging studies in 2021 showed potential right S1 nerve root impingement however no current symptoms of this. Would not recommend spine injection at this time. Continue with stretching.  He is working full-time and is independent with all self-care and mobility.  Will continue tramadol 100 mg 4 times daily UDS today 2.  Cervical  spine pain no signs or symptoms of radiculopathy.  Has been ongoing for at least 6 months.  Will check x-rays, suspect degenerative changes.  Will review x-ray when available. Return to clinic 6 months.

## 2022-12-08 NOTE — Patient Instructions (Signed)
Cervical Strain and Sprain Rehab Ask your health care provider which exercises are safe for you. Do exercises exactly as told by your health care provider and adjust them as directed. It is normal to feel mild stretching, pulling, tightness, or discomfort as you do these exercises. Stop right away if you feel sudden pain or your pain gets worse. Do not begin these exercises until told by your health care provider. Stretching and range-of-motion exercises Cervical side bending  Using good posture, sit on a stable chair or stand up. Without moving your shoulders, slowly tilt your left / right ear to your shoulder until you feel a stretch in the neck muscles on the opposite side. You should be looking straight ahead. Hold for __________ seconds. Repeat with the other side of your neck. Repeat __________ times. Complete this exercise __________ times a day. Cervical rotation  Using good posture, sit on a stable chair or stand up. Slowly turn your head to the side as if you are looking over your left / right shoulder. Keep your eyes level with the ground. Stop when you feel a stretch along the side and the back of your neck. Hold for __________ seconds. Repeat this by turning to your other side. Repeat __________ times. Complete this exercise __________ times a day. Thoracic extension and pectoral stretch  Roll a towel or a small blanket so it is about 4 inches (10 cm) in diameter. Lie down on your back on a firm surface. Put the towel in the middle of your back across your spine. It should not be under your shoulder blades. Put your hands behind your head and let your elbows fall out to your sides. Hold for __________ seconds. Repeat __________ times. Complete this exercise __________ times a day. Strengthening exercises Upper cervical flexion  Lie on your back with a thin pillow behind your head or a small, rolled-up towel under your neck. Gently tuck your chin toward your chest and nod  your head down to look toward your feet. Do not lift your head off the pillow. Hold for __________ seconds. Release the tension slowly. Relax your neck muscles completely before you repeat this exercise. Repeat __________ times. Complete this exercise __________ times a day. Cervical extension  Stand about 6 inches (15 cm) away from a wall, with your back facing the wall. Place a soft object, about 6-8 inches (15-20 cm) in diameter, between the back of your head and the wall. A soft object could be a small pillow, a ball, or a folded towel. Gently tilt your head back and press into the soft object. Keep your jaw and forehead relaxed. Hold for __________ seconds. Release the tension slowly. Relax your neck muscles completely before you repeat this exercise. Repeat __________ times. Complete this exercise __________ times a day. Posture and body mechanics Body mechanics refer to the movements and positions of your body while you do your daily activities. Posture is part of body mechanics. Good posture and healthy body mechanics can help to relieve stress in your body's tissues and joints. Good posture means that your spine is in its natural S-curve position (your spine is neutral), your shoulders are pulled back slightly, and your head is not tipped forward. The following are general guidelines for using improved posture and body mechanics in your everyday activities. Sitting  When sitting, keep your spine neutral and keep your feet flat on the floor. Use a footrest, if needed, and keep your thighs parallel to the floor. Avoid rounding   your shoulders. Avoid tilting your head forward. When working at a desk or a computer, keep your desk at a height where your hands are slightly lower than your elbows. Slide your chair under your desk so you are close enough to maintain good posture. When working at a computer, place your monitor at a height where you are looking straight ahead and you do not have to  tilt your head forward or downward to look at the screen. Standing  When standing, keep your spine neutral and keep your feet about hip-width apart. Keep a slight bend in your knees. Your ears, shoulders, and hips should line up. When you do a task in which you stand in one place for a long time, place one foot up on a stable object that is 2-4 inches (5-10 cm) high, such as a footstool. This helps keep your spine neutral. Resting When lying down and resting, avoid positions that are most painful for you. Try to support your neck in a neutral position. You can use a contour pillow or a small rolled-up towel. Your pillow should support your neck but not push on it. This information is not intended to replace advice given to you by your health care provider. Make sure you discuss any questions you have with your health care provider. Document Revised: 04/27/2022 Document Reviewed: 04/27/2022 Elsevier Patient Education  2023 Else[employer] Inc[employer]  

## 2022-12-11 LAB — TOXASSURE SELECT,+ANTIDEPR,UR

## 2022-12-15 ENCOUNTER — Encounter: Payer: Self-pay | Admitting: Family Medicine

## 2022-12-15 ENCOUNTER — Ambulatory Visit (INDEPENDENT_AMBULATORY_CARE_PROVIDER_SITE_OTHER): Payer: BC Managed Care – PPO | Admitting: Family Medicine

## 2022-12-15 VITALS — BP 134/85 | HR 85 | Temp 98.5°F | Ht 72.0 in | Wt 206.0 lb

## 2022-12-15 DIAGNOSIS — Z13220 Encounter for screening for lipoid disorders: Secondary | ICD-10-CM

## 2022-12-15 DIAGNOSIS — R319 Hematuria, unspecified: Secondary | ICD-10-CM | POA: Diagnosis not present

## 2022-12-15 DIAGNOSIS — R5382 Chronic fatigue, unspecified: Secondary | ICD-10-CM

## 2022-12-15 DIAGNOSIS — F419 Anxiety disorder, unspecified: Secondary | ICD-10-CM

## 2022-12-15 DIAGNOSIS — Z8042 Family history of malignant neoplasm of prostate: Secondary | ICD-10-CM | POA: Diagnosis not present

## 2022-12-15 DIAGNOSIS — F331 Major depressive disorder, recurrent, moderate: Secondary | ICD-10-CM | POA: Diagnosis not present

## 2022-12-15 DIAGNOSIS — F330 Major depressive disorder, recurrent, mild: Secondary | ICD-10-CM | POA: Diagnosis not present

## 2022-12-15 DIAGNOSIS — Z8269 Family history of other diseases of the musculoskeletal system and connective tissue: Secondary | ICD-10-CM | POA: Diagnosis not present

## 2022-12-15 DIAGNOSIS — F17200 Nicotine dependence, unspecified, uncomplicated: Secondary | ICD-10-CM

## 2022-12-15 DIAGNOSIS — R252 Cramp and spasm: Secondary | ICD-10-CM | POA: Diagnosis not present

## 2022-12-15 DIAGNOSIS — R6889 Other general symptoms and signs: Secondary | ICD-10-CM | POA: Diagnosis not present

## 2022-12-15 DIAGNOSIS — Z0001 Encounter for general adult medical examination with abnormal findings: Secondary | ICD-10-CM

## 2022-12-15 DIAGNOSIS — Z0000 Encounter for general adult medical examination without abnormal findings: Secondary | ICD-10-CM

## 2022-12-15 LAB — URINALYSIS
Bilirubin, UA: NEGATIVE
Glucose, UA: NEGATIVE
Ketones, UA: NEGATIVE
Leukocytes,UA: NEGATIVE
Nitrite, UA: NEGATIVE
Protein,UA: NEGATIVE
Specific Gravity, UA: 1.02 (ref 1.005–1.030)
Urobilinogen, Ur: 1 mg/dL (ref 0.2–1.0)
pH, UA: 7.5 (ref 5.0–7.5)

## 2022-12-15 NOTE — Progress Notes (Signed)
Logan Young is a 42 y.o. male presents to office today for annual physical exam examination.    Concerns today include: 1.  Chronic pain, neck, shoulders He is under the care of of Dr. Letta Pate in [place].  He is treated with Flexeril, gabapentin, meloxicam and 100 mg of tramadol 4 times daily.  He notes that this does significantly help the pain but never resolves it.  He tries to give himself a medication holiday on the days that he is off of work.  He admits though that he pretty much sits on the couch during those days because he is in so much pain.  He does have flareups of the pain approximately 1 time per month that can last anywhere from 2 days to 14 days.  He typically works 6 days/week.  These are 8-hour days.  He has tried chiropractic medicine and even before but did not find that helpful.  He does not report any weakness of the upper extremities but does report some cramping in the right lower extremity that is most prevalent after work.  He is to be on some type of pink pill that would help with this but he is not sure what the medication was.  He continues to suffer from chronic fatigue.  He would like to proceed with testosterone and B12 checks today.  Upon further discussion his father had history of lupus.  He is not sure that he is ever been checked for autoimmune disease but would be willing to do so today.  Stress has been higher as well as his mother resides with him.  She has history of CVA and he pretty much cares for her.  Previously treated with Cymbalta.  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   Past Medical History:  Diagnosis Date   Anxiety 09/10/2021   Chronic low back pain    Depression    Insomnia    Migraines    Social History    Socioeconomic History   Marital status: Legally Separated    Spouse name: Not on file   Number of children: Not on file   Years of education: Not on file   Highest education level: Not on file  Occupational History   Not on file  Tobacco Use   Smoking status: Every Day    Packs/day: 1.00    Years: 12.00    Total pack years: 12.00    Types: Cigarettes   Smokeless tobacco: Never  Vaping Use   Vaping Use: Never used  Substance and Sexual Activity   Alcohol use: Yes    Comment: rare   Drug use: No   Sexual activity: Not on file  Other Topics Concern   Not on file  Social History Narrative   Not on file   Social Determinants of Health   Financial Resource Strain: Not on file  Food Insecurity: Not on file  Transportation Needs: Not on file  Physical Activity: Not on file  Stress: Not on file  Social Connections: Not on file  Intimate Partner Violence: Not on file   Past Surgical History:  Procedure Laterality Date   APPENDECTOMY  AGE 85   CHOLECYSTECTOMY  12/2013   Family History  Problem Relation Age of Onset   Diabetes Mother    Cancer Mother  rectal   Lupus Father    Cancer Father        stomach    Current Outpatient Medications:    clindamycin (CLEOCIN) 150 MG capsule, Take 150 mg by mouth 3 (three) times daily., Disp: , Rfl:    cyclobenzaprine (FLEXERIL) 10 MG tablet, TAKE 1 TABLET BY MOUTH AT BEDTIME, Disp: 30 tablet, Rfl: 0   gabapentin (NEURONTIN) 100 MG capsule, Take 100 mg by mouth 2 (two) times daily., Disp: , Rfl:    ibuprofen (ADVIL) 800 MG tablet, Take 800 mg by mouth every 8 (eight) hours as needed., Disp: , Rfl:    meloxicam (MOBIC) 15 MG tablet, Take 15 mg by mouth daily., Disp: , Rfl:    traMADol (ULTRAM) 50 MG tablet, Take 2 tablets (100 mg total) by mouth 4 (four) times daily., Disp: 240 tablet, Rfl: 5  Allergies  Allergen Reactions   Misc. Sulfonamide Containing Compounds    Sulfa Antibiotics Itching     ROS: Review of  Systems A comprehensive review of systems was negative except for: Ears, nose, mouth, throat, and face: positive for hearing loss Gastrointestinal: positive for IBS C/D Musculoskeletal: positive for arthralgias, back pain, and myalgias Behavioral/Psych: positive for stress    Physical exam BP 134/85   Pulse 85   Temp 98.5 F (36.9 C)   Ht 6' (1.829 m)   Wt 206 lb (93.4 kg)   SpO2 99%   BMI 27.94 kg/m  General appearance: alert, cooperative, appears stated age, and no distress Head: Normocephalic, without obvious abnormality, atraumatic Eyes: negative findings: lids and lashes normal, conjunctivae and sclerae normal, corneas clear, and pupils equal, round, reactive to light and accomodation Ears: normal TM's and external ear canals both ears Nose: Nares normal. Septum midline. Mucosa normal. No drainage or sinus tenderness. Throat: lips, mucosa, and tongue normal; teeth and gums normal Neck: no adenopathy, supple, symmetrical, trachea midline, and thyroid not enlarged, symmetric, no tenderness/mass/nodules Back: symmetric, no curvature. ROM normal. No CVA tenderness. Lungs: clear to auscultation bilaterally Chest wall: no tenderness Heart: regular rate and rhythm, S1, S2 normal, no murmur, click, rub or gallop Abdomen: soft, non-tender; bowel sounds normal; no masses,  no organomegaly Extremities: extremities normal, atraumatic, no cyanosis or edema Pulses: 2+ and symmetric Skin: Skin color, texture, turgor normal. No rashes or lesions Lymph nodes: Cervical, supraclavicular, and axillary nodes normal. Neurologic: Grossly normal Psych: Affect flat.  Very pleasant and interactive but appears depressed when he is talking about chronic pain     12/15/2022    9:17 AM 12/08/2022    3:24 PM 05/21/2022   10:49 AM  Depression screen PHQ 2/9  Decreased Interest 3 0 0  Down, Depressed, Hopeless 3 0 0  PHQ - 2 Score 6 0 0  Altered sleeping 3    Tired, decreased energy 3    Change in  appetite 0    Feeling bad or failure about yourself  0    Trouble concentrating 1    Moving slowly or fidgety/restless 0    Suicidal thoughts 1    PHQ-9 Score 14    Difficult doing work/chores Extremely dIfficult        12/15/2022    9:11 AM 10/28/2021    2:21 PM 09/10/2021   10:37 AM  GAD 7 : Generalized Anxiety Score  Nervous, Anxious, on Edge 0 1 1  Control/stop worrying 0 3 3  Worry too much - different things '2 3 3  '$ Trouble relaxing 3 3 3  Restless 0 0 0  Easily annoyed or irritable '3 3 1  '$ Afraid - awful might happen 1 1 0  Total GAD 7 Score '9 14 11  '$ Anxiety Difficulty Extremely difficult Extremely difficult Extremely difficult    Assessment/ Plan: Logan Young here for annual physical exam.   Annual physical exam  Family history of prostate cancer in father - Plan: PSA  Tobacco use disorder - Plan: CBC  Hematuria, unspecified type - Plan: CBC, Urinalysis  Screening, lipid - Plan: Lipid Panel  Family history of systemic lupus erythematosus - Plan: ANA w/Reflex if Positive, C-reactive protein, Sedimentation Rate  Chronic fatigue - Plan: T4, Free, Testosterone, ANA w/Reflex if Positive, C-reactive protein, Sedimentation Rate  Leg cramping - Plan: Vitamin B12, Magnesium  Anxiety - Plan: Ambulatory referral to Psychology  Moderate episode of recurrent major depressive disorder ([place]) - Plan: CMP14+EGFR, TSH, Ambulatory referral to Psychology, CANCELED: Ambulatory referral to Psychology  Check prostate level.  Asymptomatic from a prostate standpoint.  Check CBC given hematuria noted on recent blood noted in urine back in October.  I am going to evaluate him for autoimmune disease given family history of lupus, ongoing fatigue, chronic pain.  I have also ordered testosterone level, thyroid levels and evaluation for anemia.  He scored quite high on both anxiety and depressive scores today.  I am going to refer him to therapy.  We could certainly consider adding  medication if he desires going forward.  However we will need to be very full since he is on high-dose of tramadol and other medications that cross the blood-brain barrier.  His pain specialist has a future order in for x-ray and if he needs to have something like that done here and glad to order.  Anticipate he may need an MRI at the end of the day but I discussed with him that sometimes insurance will require x-rays before they will proceed with MRI tests.  Counseled on healthy lifestyle choices, including diet (rich in fruits, vegetables and lean meats and low in salt and simple carbohydrates) and exercise (at least 30 minutes of moderate physical activity daily).  Patient to follow up in 1 year for annual exam or sooner if needed.  [place] M. Lajuana Ripple, DO

## 2022-12-15 NOTE — Patient Instructions (Signed)
You had labs performed today.  You will be contacted with the results of the labs once they are available, usually in the next 3 business days for routine lab work.  If you have an active my chart account, they will be released to your MyChart.  If you prefer to have these labs released to you via telephone, please let us know.   Preventive Care 53-42 Years Old, Male Preventive care refers to lifestyle choices and visits with your health care provider that can promote health and wellness. Preventive care visits are also called wellness exams. What can I expect for my preventive care visit? Counseling During your preventive care visit, your health care provider may ask about your: Medical history, including: Past medical problems. Family medical history. Current health, including: Emotional well-being. Home life and relationship well-being. Sexual activity. Lifestyle, including: Alcohol, nicotine or tobacco, and drug use. Access to firearms. Diet, exercise, and sleep habits. Safety issues such as seatbelt and bike helmet use. Sunscreen use. Work and work Statistician. Physical exam Your health care provider will check your: Height and weight. These may be used to calculate your BMI (body mass index). BMI is a measurement that tells if you are at a healthy weight. Waist circumference. This measures the distance around your waistline. This measurement also tells if you are at a healthy weight and may help predict your risk of certain diseases, such as type 2 diabetes and high blood pressure. Heart rate and blood pressure. Body temperature. Skin for abnormal spots. What immunizations do I need?  Vaccines are usually given at various ages, according to a schedule. Your health care provider will recommend vaccines for you based on your age, medical history, and lifestyle or other factors, such as travel or where you work. What tests do I need? Screening Your health care provider may  recommend screening tests for certain conditions. This may include: Lipid and cholesterol levels. Diabetes screening. This is done by checking your blood sugar (glucose) after you have not eaten for a while (fasting). Hepatitis B test. Hepatitis C test. HIV (human immunodeficiency virus) test. STI (sexually transmitted infection) testing, if you are at risk. Lung cancer screening. Prostate cancer screening. Colorectal cancer screening. Talk with your health care provider about your test results, treatment options, and if necessary, the need for more tests. Follow these instructions at home: Eating and drinking  Eat a diet that includes fresh fruits and vegetables, whole grains, lean protein, and low-fat dairy products. Take vitamin and mineral supplements as recommended by your health care provider. Do not drink alcohol if your health care provider tells you not to drink. If you drink alcohol: Limit how much you have to 0-2 drinks a day. Know how much alcohol is in your drink. In the [place] one drink equals one 12 oz bottle of beer (355 mL), one 5 oz glass of wine (148 mL), or one 1 oz glass of hard liquor (44 mL). Lifestyle Brush your teeth every morning and night with fluoride toothpaste. Floss one time each day. Exercise for at least 30 minutes 5 or more days each week. Do not use any products that contain nicotine or tobacco. These products include cigarettes, chewing tobacco, and vaping devices, such as e-cigarettes. If you need help quitting, ask your health care provider. Do not use drugs. If you are sexually active, practice safe sex. Use a condom or other form of protection to prevent STIs. Take aspirin only as told by your health care provider. Make sure  that you understand how much to take and what form to take. Work with your health care provider to find out whether it is safe and beneficial for you to take aspirin daily. Find healthy ways to manage stress, such  as: Meditation, yoga, or listening to music. Journaling. Talking to a trusted person. Spending time with friends and family. Minimize exposure to UV radiation to reduce your risk of skin cancer. Safety Always wear your seat belt while driving or riding in a vehicle. Do not drive: If you have been drinking alcohol. Do not ride with someone who has been drinking. When you are tired or distracted. While texting. If you have been using any mind-altering substances or drugs. Wear a helmet and other protective equipment during sports activities. If you have firearms in your house, make sure you follow all gun safety procedures. What's next? Go to your health care provider once a year for an annual wellness visit. Ask your health care provider how often you should have your eyes and teeth checked. Stay up to date on all vaccines. This information is not intended to replace advice given to you by your health care provider. Make sure you discuss any questions you have with your health care provider. Document Revised: 04/02/2021 Document Reviewed: 04/02/2021 Elsevier Patient Education  [place].

## 2022-12-16 ENCOUNTER — Other Ambulatory Visit: Payer: Self-pay | Admitting: Family Medicine

## 2022-12-16 ENCOUNTER — Other Ambulatory Visit: Payer: Self-pay

## 2022-12-16 DIAGNOSIS — F331 Major depressive disorder, recurrent, moderate: Secondary | ICD-10-CM

## 2022-12-16 DIAGNOSIS — E782 Mixed hyperlipidemia: Secondary | ICD-10-CM

## 2022-12-16 MED ORDER — DULOXETINE HCL 30 MG PO CPEP: 30.0000 mg | ORAL_CAPSULE | Freq: Every day | ORAL | 1 refills | Status: AC

## 2022-12-16 MED ORDER — ROSUVASTATIN CALCIUM 10 MG PO TABS: 10.0000 mg | ORAL_TABLET | Freq: Every day | ORAL | 3 refills | Status: AC

## 2022-12-17 LAB — LIPID PANEL
Chol/HDL Ratio: 5.9 ratio — ABNORMAL HIGH (ref 0.0–5.0)
Cholesterol, Total: 206 mg/dL — ABNORMAL HIGH (ref 100–199)
HDL: 35 mg/dL — ABNORMAL LOW (ref >39)
LDL Chol Calc (NIH): 145 mg/dL — ABNORMAL HIGH (ref 0–99)
Triglycerides: 141 mg/dL (ref 0–149)
VLDL Cholesterol Cal: 26 mg/dL (ref 5–40)

## 2022-12-17 LAB — CMP14+EGFR
ALT: 19 IU/L (ref 0–44)
AST: 22 IU/L (ref 0–40)
Albumin/Globulin Ratio: 2.1 (ref 1.2–2.2)
Albumin: 4.8 g/dL (ref 4.1–5.1)
Alkaline Phosphatase: 106 IU/L (ref 44–121)
BUN/Creatinine Ratio: 8 — ABNORMAL LOW (ref 9–20)
BUN: 6 mg/dL (ref 6–24)
Bilirubin Total: 0.4 mg/dL (ref 0.0–1.2)
CO2: 24 mmol/L (ref 20–29)
Calcium: 9.6 mg/dL (ref 8.7–10.2)
Chloride: 105 mmol/L (ref 96–106)
Creatinine, Ser: 0.77 mg/dL (ref 0.76–1.27)
Globulin, Total: 2.3 g/dL (ref 1.5–4.5)
Glucose: 80 mg/dL (ref 70–99)
Potassium: 4.2 mmol/L (ref 3.5–5.2)
Sodium: 143 mmol/L (ref 134–144)
Total Protein: 7.1 g/dL (ref 6.0–8.5)
eGFR: 115 mL/min/{1.73_m2} (ref >59)

## 2022-12-17 LAB — SEDIMENTATION RATE: Sed Rate: 3 mm/hr (ref 0–15)

## 2022-12-17 LAB — MAGNESIUM: Magnesium: 2.1 mg/dL (ref 1.6–2.3)

## 2022-12-17 LAB — VITAMIN B12: Vitamin B-12: 732 pg/mL (ref 232–1245)

## 2022-12-17 LAB — CBC
Hematocrit: 42.6 % (ref 37.5–51.0)
Hemoglobin: 14.3 g/dL (ref 13.0–17.7)
MCH: 31.8 pg (ref 26.6–33.0)
MCHC: 33.6 g/dL (ref 31.5–35.7)
MCV: 95 fL (ref 79–97)
Platelets: 317 10*3/uL (ref 150–450)
RBC: 4.49 x10E6/uL (ref 4.14–5.80)
RDW: 11.9 % (ref 11.6–15.4)
WBC: 8.6 10*3/uL (ref 3.4–10.8)

## 2022-12-17 LAB — C-REACTIVE PROTEIN: CRP: <1 mg/L (ref 0–10)

## 2022-12-17 LAB — T4, FREE: Free T4: 1.44 ng/dL (ref 0.82–1.77)

## 2022-12-17 LAB — ANA W/REFLEX IF POSITIVE: Anti Nuclear Antibody (ANA): NEGATIVE

## 2022-12-17 LAB — TESTOSTERONE: Testosterone: 442 ng/dL (ref 264–916)

## 2022-12-17 LAB — PSA: Prostate Specific Ag, Serum: 1.1 ng/mL (ref 0.0–4.0)

## 2022-12-17 LAB — TSH: TSH: 0.849 u[IU]/mL (ref 0.450–4.500)

## 2022-12-21 ENCOUNTER — Ambulatory Visit: Payer: BC Managed Care – PPO | Admitting: Family Medicine

## 2022-12-28 ENCOUNTER — Telehealth: Payer: Self-pay | Admitting: Family Medicine

## 2022-12-28 NOTE — Telephone Encounter (Signed)
Patient is waiting for a referral for pain management. Said that it was discussed with PCP. Please call back and advise.

## 2022-12-28 NOTE — Telephone Encounter (Signed)
R/c

## 2023-01-05 ENCOUNTER — Telehealth: Payer: Self-pay | Admitting: Family Medicine

## 2023-01-05 DIAGNOSIS — M545 Low back pain, unspecified: Secondary | ICD-10-CM

## 2023-01-05 NOTE — Telephone Encounter (Signed)
REFERRAL REQUEST Telephone Note  Have you been seen at our office for this problem? yes (Advise that they may need an appointment with their PCP before a referral can be done)  Reason for Referral: not sure Referral discussed with patient: yes  Best contact number of patient for referral team: [phone]    Has patient been seen by a specialist for this issue before: no  Patient provider preference for referral: [place]? [phone] Patient location preference for referral: [facility] office   Patient notified that referrals can take up to a week or longer to process. If they haven't heard anything within a week they should call back and speak with the referral department.    Gottschalk's pt  Still waiting on someone to call him.

## 2023-01-05 NOTE — Telephone Encounter (Signed)
Left vm for cb

## 2023-01-05 NOTE — Telephone Encounter (Signed)
I'm very confused.  He was referred to PSYCHIATRY but is already seeing a Pain doctor, Dr Letta Pate.  Was he wanting to see a surgeon?

## 2023-01-08 NOTE — Telephone Encounter (Signed)
done

## 2023-01-12 ENCOUNTER — Telehealth: Payer: Self-pay | Admitting: Physical Medicine & Rehabilitation

## 2023-01-12 NOTE — Telephone Encounter (Signed)
Patient stated he was returning you call. Please call him at [phone]

## 2023-02-08 DIAGNOSIS — G894 Chronic pain syndrome: Secondary | ICD-10-CM | POA: Diagnosis not present

## 2023-02-08 DIAGNOSIS — Z79891 Long term (current) use of opiate analgesic: Secondary | ICD-10-CM | POA: Diagnosis not present

## 2023-02-08 DIAGNOSIS — Z79899 Other long term (current) drug therapy: Secondary | ICD-10-CM | POA: Diagnosis not present

## 2023-02-24 ENCOUNTER — Telehealth: Payer: BC Managed Care – PPO | Admitting: Family Medicine

## 2023-02-24 DIAGNOSIS — G8929 Other chronic pain: Secondary | ICD-10-CM | POA: Diagnosis not present

## 2023-02-24 DIAGNOSIS — M545 Low back pain, unspecified: Secondary | ICD-10-CM | POA: Diagnosis not present

## 2023-02-24 MED ORDER — MELOXICAM 15 MG PO TABS: 15.0000 mg | ORAL_TABLET | Freq: Every day | ORAL | 0 refills | Status: DC

## 2023-02-24 NOTE — Progress Notes (Unsigned)
   Virtual Visit via video Note   Due to COVID-19 pandemic this visit was conducted virtually. This visit type was conducted due to national recommendations for restrictions regarding the COVID-19 Pandemic (e.g. social distancing, sheltering in place) in an effort to limit this patient's exposure and mitigate transmission in our community. All issues noted in this document were discussed and addressed.  A physical exam was not performed with this format.  I connected with  Logan Young  on 02/24/23 at [phone] by video and verified that I am speaking with the correct person using two identifiers. Logan Young is currently located at home and his significant other is currently with him during the visit. The provider, Gabriel Earing, FNP is located in their office at time of visit.  I discussed the limitations, risks, security and privacy concerns of performing an evaluation and management service by video  and the availability of in person appointments. I also discussed with the patient that there may be a patient responsible charge related to this service. The patient expressed understanding and agreed to proceed.   History and Present Illness:  Logan Young has a history of chronic lower back pain. He is established with pain management that prescribes tramadol that he takes daily. He reports an achy pain in his lower back on both sides. He sometimes has spasms that he uses muscle relaxer for. Reports warmth to the touch at times that has been intermittent for months. Denies fever, chills, swelling, or wound. Denies numbness, tingling, saddle anesthesia, changes in bowel or bladder control. He has also been taking cymbalta and using lidocaine patches. He has been out of mobic and would like a refill today.    ROS As per HPI.     Observations/Objective: Alert and oriented. Respirations unlabored. No cyanosis. Non toxic appearing. Normal mood and behavior.    Assessment and Plan: Logan Young was seen today  for back pain.  Diagnoses and all orders for this visit:  Chronic bilateral low back pain without sciatica Stable symptoms. No red flags. Mobic refills. Continue follow up with pain management.  -     meloxicam (MOBIC) 15 MG tablet; Take 1 tablet (15 mg total) by mouth daily.     Follow Up Instructions: As needed.     I discussed the assessment and treatment plan with the patient. The patient was provided an opportunity to ask questions and all were answered. The patient agreed with the plan and demonstrated an understanding of the instructions.   The patient was advised to call back or seek an in-person evaluation if the symptoms worsen or if the condition fails to improve as anticipated.  The above assessment and management plan was discussed with the patient. The patient verbalized understanding of and has agreed to the management plan. Patient is aware to call the clinic if symptoms persist or worsen. Patient is aware when to return to the clinic for a follow-up visit. Patient educated on when it is appropriate to go to the emergency department.   Time call ended: 1620  I provided 7 minutes of face-to-face time during this encounter.    Gabriel Earing, FNP ,

## 2023-02-25 ENCOUNTER — Encounter: Payer: Self-pay | Admitting: Family Medicine

## 2023-03-08 DIAGNOSIS — G894 Chronic pain syndrome: Secondary | ICD-10-CM | POA: Diagnosis not present

## 2023-03-08 DIAGNOSIS — M545 Low back pain, unspecified: Secondary | ICD-10-CM | POA: Diagnosis not present

## 2023-03-08 DIAGNOSIS — Z79891 Long term (current) use of opiate analgesic: Secondary | ICD-10-CM | POA: Diagnosis not present

## 2023-03-08 DIAGNOSIS — M542 Cervicalgia: Secondary | ICD-10-CM | POA: Diagnosis not present

## 2023-03-16 ENCOUNTER — Ambulatory Visit: Payer: BC Managed Care – PPO | Admitting: Family Medicine

## 2023-03-30 ENCOUNTER — Ambulatory Visit: Payer: BC Managed Care – PPO | Admitting: Family Medicine

## 2023-04-05 DIAGNOSIS — G894 Chronic pain syndrome: Secondary | ICD-10-CM | POA: Diagnosis not present

## 2023-04-05 DIAGNOSIS — M542 Cervicalgia: Secondary | ICD-10-CM | POA: Diagnosis not present

## 2023-04-05 DIAGNOSIS — M545 Low back pain, unspecified: Secondary | ICD-10-CM | POA: Diagnosis not present

## 2023-04-05 DIAGNOSIS — Z79899 Other long term (current) drug therapy: Secondary | ICD-10-CM | POA: Diagnosis not present

## 2023-04-05 DIAGNOSIS — Z79891 Long term (current) use of opiate analgesic: Secondary | ICD-10-CM | POA: Diagnosis not present

## 2023-04-09 ENCOUNTER — Other Ambulatory Visit: Payer: Self-pay | Admitting: Family Medicine

## 2023-04-09 DIAGNOSIS — M545 Low back pain, unspecified: Secondary | ICD-10-CM

## 2023-05-17 DIAGNOSIS — M47812 Spondylosis without myelopathy or radiculopathy, cervical region: Secondary | ICD-10-CM | POA: Diagnosis not present

## 2023-05-17 DIAGNOSIS — G894 Chronic pain syndrome: Secondary | ICD-10-CM | POA: Diagnosis not present

## 2023-05-17 DIAGNOSIS — M542 Cervicalgia: Secondary | ICD-10-CM | POA: Diagnosis not present

## 2023-05-17 DIAGNOSIS — R52 Pain, unspecified: Secondary | ICD-10-CM | POA: Diagnosis not present

## 2023-05-17 DIAGNOSIS — Z79899 Other long term (current) drug therapy: Secondary | ICD-10-CM | POA: Diagnosis not present

## 2023-05-17 DIAGNOSIS — M545 Low back pain, unspecified: Secondary | ICD-10-CM | POA: Diagnosis not present

## 2023-05-17 DIAGNOSIS — Z79891 Long term (current) use of opiate analgesic: Secondary | ICD-10-CM | POA: Diagnosis not present

## 2023-05-28 ENCOUNTER — Ambulatory Visit: Payer: BC Managed Care – PPO | Admitting: Family Medicine

## 2023-06-08 ENCOUNTER — Encounter: Payer: BC Managed Care – PPO | Admitting: Physical Medicine & Rehabilitation

## 2023-06-18 ENCOUNTER — Encounter: Payer: Self-pay | Admitting: Family Medicine

## 2023-06-18 ENCOUNTER — Telehealth (INDEPENDENT_AMBULATORY_CARE_PROVIDER_SITE_OTHER): Payer: BC Managed Care – PPO | Admitting: Family Medicine

## 2023-06-18 DIAGNOSIS — R252 Cramp and spasm: Secondary | ICD-10-CM

## 2023-06-18 MED ORDER — BACLOFEN 10 MG PO TABS: 10.0000 mg | ORAL_TABLET | Freq: Three times a day (TID) | ORAL | 0 refills | Status: AC | PRN

## 2023-06-18 NOTE — Progress Notes (Signed)
MyChart Video visit  Subjective: CC:leg cramps PCP: Raliegh Ip, DO ZOX:WRUEAV Young is a 42 y.o. male. Patient provides verbal consent for consult held via video.  Due to COVID-19 pandemic this visit was conducted virtually. This visit type was conducted due to national recommendations for restrictions regarding the COVID-19 Pandemic (e.g. social distancing, sheltering in place) in an effort to limit this patient's exposure and mitigate transmission in our community. All issues noted in this document were discussed and addressed.  A physical exam was not performed with this format.   Location of patient: home Location of provider: [facility] Others present for call: none  1.  Leg cramping Patient has been having this issue for years.  Its always been predominantly in the right lower extremity but also is in the left.  He discussed this with his previous pain manager quite a while ago and they recommended that he come off of sodas and cigarettes and that hopefully symptoms would resolve.  We collected labs in February but could not find any metabolic etiology of symptoms.  He notes that he has been taking some leftover methocarbamol from Dr. Gerilyn Pilgrim but he has not seen any significant improvement in leg cramping.  Sometimes leg cramping can be pretty debilitating and often wakes him up in the middle the night such that he does not feel well rested the following day.  He was started on some new medications but does not know the name of them.  They discussed consideration for repeat MRI of the spine and this is pending her review of the x-ray that was obtained at the pain management Center in [place].   ROS: Per HPI  Allergies  Allergen Reactions   Misc. Sulfonamide Containing Compounds    Sulfa Antibiotics Itching   Past Medical History:  Diagnosis Date   Anxiety 09/10/2021   Chronic low back pain    Depression    Insomnia    Migraines     Current Outpatient Medications:     clindamycin (CLEOCIN) 150 MG capsule, Take 150 mg by mouth 3 (three) times daily., Disp: , Rfl:    cyclobenzaprine (FLEXERIL) 10 MG tablet, TAKE 1 TABLET BY MOUTH AT BEDTIME, Disp: 30 tablet, Rfl: 0   DULoxetine (CYMBALTA) 30 MG capsule, Take 1 capsule (30 mg total) by mouth daily., Disp: 90 capsule, Rfl: 1   ibuprofen (ADVIL) 800 MG tablet, Take 800 mg by mouth every 8 (eight) hours as needed., Disp: , Rfl:    meloxicam (MOBIC) 15 MG tablet, TAKE 1 TABLET BY MOUTH DAILY, Disp: 30 tablet, Rfl: 1   rosuvastatin (CRESTOR) 10 MG tablet, Take 1 tablet (10 mg total) by mouth daily., Disp: 90 tablet, Rfl: 3   traMADol (ULTRAM) 50 MG tablet, Take 2 tablets (100 mg total) by mouth 4 (four) times daily., Disp: 240 tablet, Rfl: 5  Gen: nontoxic male   Assessment/ Plan: 42 y.o. male   Leg cramping - Plan: baclofen (LIORESAL) 10 MG tablet, US ARTERIAL ABI (SCREENING LOWER EXTREMITY)  ?from lumbar etiology.  Given smoking history will eval for PAD.  Added baclofen.  If no improvement, could consider requip.  Though sounds like MRI warranted.  Pain management specialist arranging per patient.  Start time: 12:56p End time: 1:07pm  Total time spent on patient care (including video visit/ documentation): 15 minutes  Logan Koors Hulen Skains, DO [facility] [phone]

## 2023-06-25 ENCOUNTER — Ambulatory Visit (HOSPITAL_COMMUNITY): Payer: BC Managed Care – PPO | Attending: Family Medicine

## 2023-07-07 ENCOUNTER — Other Ambulatory Visit: Payer: Self-pay | Admitting: Family Medicine

## 2023-07-07 DIAGNOSIS — G8929 Other chronic pain: Secondary | ICD-10-CM

## 2023-07-26 DIAGNOSIS — M542 Cervicalgia: Secondary | ICD-10-CM | POA: Diagnosis not present

## 2023-07-26 DIAGNOSIS — Z79891 Long term (current) use of opiate analgesic: Secondary | ICD-10-CM | POA: Diagnosis not present

## 2023-07-26 DIAGNOSIS — G894 Chronic pain syndrome: Secondary | ICD-10-CM | POA: Diagnosis not present

## 2023-07-26 DIAGNOSIS — M545 Low back pain, unspecified: Secondary | ICD-10-CM | POA: Diagnosis not present

## 2023-09-30 ENCOUNTER — Telehealth: Payer: Self-pay | Admitting: Family Medicine

## 2023-09-30 NOTE — Telephone Encounter (Signed)
Copied from CRM [id number]. Topic: Clinical - Medical Advice >> Sep 30, 2023  1:17 PM Almira Coaster wrote: Reason for CRM: Patient's girlfriend called because patient is dealing with severe back pain caused by bulging disc, They would like to know if he can be referred to a specialist.

## 2023-10-01 NOTE — Telephone Encounter (Signed)
I need to know what he is looking for.  He's been referral to neurosurgery, pain management, physical therapy in the past.  Is he looking to see a surgeon again?

## 2023-10-01 NOTE — Telephone Encounter (Signed)
Left message to call back  

## 2023-10-08 ENCOUNTER — Telehealth: Payer: BC Managed Care – PPO | Admitting: Physician Assistant

## 2023-10-08 DIAGNOSIS — M549 Dorsalgia, unspecified: Secondary | ICD-10-CM

## 2023-10-09 NOTE — Progress Notes (Signed)
Because of your symptoms, I feel your condition warrants further evaluation and I recommend that you be seen in a face to face visit.   NOTE: There will be NO CHARGE for this eVisit   If you are having a true medical emergency please call 911.      For an urgent face to face visit, [facility] has eight urgent care centers for your convenience:   NEW!! [facility] Health Urgent Care Center at [facility] Get Driving Directions [phone] [address], [address] [place], [address]    [facility] Health Urgent Care Center at [facility] Get Driving Directions [phone] [address] [place], [place] [address]   [facility] Health Urgent Care Center [facility]) Get Driving Directions [phone] [address] [place], [place] [address]  [facility] Health Urgent Care Center [facility]) Get Driving Directions [phone] [address] [place],  [place]  [address]  [facility] Health Urgent Care Center [facility] - at [employer]  [phone] [address] [place],  [place] [address]   [facility] Health Urgent Care at [facility] Get Driving Directions [phone] [address], [address] [place], [place] [address]   [facility] Health Urgent Care at [facility] Get Driving Directions  [phone] [address] [place], [place] [address]   [facility] Health Urgent Care at [facility] Directions [phone] [address], [address] [place], [place] [address]  Your MyChart E-visit questionnaire answers were reviewed by a board certified advanced clinical practitioner to complete your personal care plan based on your specific symptoms.  Thank you for using e-Visits.

## 2023-10-25 ENCOUNTER — Other Ambulatory Visit: Payer: Self-pay

## 2023-10-25 MED ORDER — TRAMADOL HCL 50 MG PO TABS: 100.0000 mg | ORAL_TABLET | Freq: Four times a day (QID) | ORAL | 0 refills | Status: DC

## 2023-10-25 NOTE — Telephone Encounter (Signed)
 Patient has requested a refill on Tramadol. Patient not seen since 12/08/22. I explained that he would need to be seen at least every 6 months to continue on Tramadol. Patient made an appointment for 11/12/23. Refill goes to [facility]

## 2023-10-30 ENCOUNTER — Other Ambulatory Visit: Payer: Self-pay | Admitting: Physical Medicine & Rehabilitation

## 2023-11-12 ENCOUNTER — Encounter: Payer: BC Managed Care – PPO | Admitting: Physical Medicine & Rehabilitation

## 2023-11-25 ENCOUNTER — Encounter
Payer: BC Managed Care – PPO | Attending: Physical Medicine & Rehabilitation | Admitting: Physical Medicine & Rehabilitation

## 2023-11-25 ENCOUNTER — Encounter: Payer: Self-pay | Admitting: Physical Medicine & Rehabilitation

## 2023-11-25 VITALS — BP 137/92 | HR 88 | Ht 72.0 in | Wt 220.0 lb

## 2023-11-25 DIAGNOSIS — G894 Chronic pain syndrome: Secondary | ICD-10-CM | POA: Diagnosis not present

## 2023-11-25 MED ORDER — TRAMADOL HCL 50 MG PO TABS: 100.0000 mg | ORAL_TABLET | Freq: Four times a day (QID) | ORAL | 0 refills | Status: AC

## 2023-11-25 MED ORDER — GABAPENTIN 300 MG PO CAPS: 300.0000 mg | ORAL_CAPSULE | Freq: Every day | ORAL | 5 refills | Status: AC

## 2023-11-25 NOTE — Progress Notes (Addendum)
 Subjective:    Patient ID: Logan Young, male    DOB: 07-Nov-1980, 43 y.o.   MRN: [id number]  HPI Last visit ~55yr ago, saw local pain PA but had only virtual visits Wishes to re establish care Does not take duloxetine  regularly  R>L leg cramps after work Occ shooting pain RIght lateral thigh The patient states that he would like to look into a more permanent fix to his back problem. MRI LUMBAR SPINE WITHOUT CONTRAST   TECHNIQUE: Multiplanar, multisequence MR imaging of the lumbar spine was performed. No intravenous contrast was administered.   COMPARISON:  MRI of the lumbar spine May 01, 2015.   FINDINGS: Segmentation:  Standard.   Alignment:  Physiologic.   Vertebrae: No fracture, evidence of discitis, or bone lesion. Endplate degenerative changes at L5-S1, developed since prior MRI.   Conus medullaris and cauda equina: Conus extends to the T12 level. Conus and cauda equina appear normal.   Paraspinal and other soft tissues: Negative.   Disc levels:   T12-L1: No spinal canal or neural foraminal stenosis.   L1-2: No spinal canal or neural foraminal stenosis.   L2-3: Shallow disc bulge without significant spinal canal or neural foraminal stenosis.   L3-4: Central disc protrusion causing indentation of the thecal sac which in association with mild facet degenerative changes and ligamentum flavum redundancy resulting in mild narrowing of the thecal sac. No significant neural foraminal stenosis. This has developed since prior MRI.   L4-5: Right asymmetric disc bulge, mild facet degenerative changes and ligamentum flavum redundancy with small joint effusion on the left. Findings result in mild spinal canal stenosis and mild right neural foraminal narrowing. This has been minimally progressed from prior MRI.   L5-S1: Loss of disc height, disc bulge with superimposed right central disc protrusion causing effacement of the subarticular zone with posterior  displacement of the traversing right S1 nerve root. There is also mild facet degenerative changes ligamentum flavum redundancy. Findings result in mild spinal canal stenosis and mild bilateral neural foraminal narrowing. The disc protrusion with has progressed since prior MRI.   IMPRESSION: 1. Interval progression of degenerative disc disease at L5-S1 with right central disc protrusion causing effacement of the subarticular zone with posterior displacement of the traversing right S1 nerve root. 2. Mild spinal canal stenosis at L3-L4, L4-L5 and L5-S1. 3. Mild right neural foraminal narrowing at L4-L5 and bilateral neural foraminal narrowing at L5-S1.     Electronically Signed   By: Katyucia  De Macedo Rodrigues M.D.   On: 03/13/2020 13:48 Pain Inventory Average Pain 8 Pain Right Now 6 My pain is dull and aching  In the last 24 hours, has pain interfered with the following? General activity 10 Relation with others 8 Enjoyment of life 10 What TIME of day is your pain at its worst? evening because work night shift and worse when wake up Sleep (in general) Poor  Pain is worse with: sitting and inactivity Pain improves with: heat/ice, pacing activities, and medication Relief from Meds: 8  Family History  Problem Relation Age of Onset   Diabetes Mother    Cancer Mother        rectal   Lupus Father    Cancer Father        stomach   Social History   Socioeconomic History   Marital status: Legally Separated    Spouse name: Not on file   Number of children: Not on file   Years of education: Not on file  Highest education level: Not on file  Occupational History   Not on file  Tobacco Use   Smoking status: Every Day    Current packs/day: 1.00    Average packs/day: 1 pack/day for 12.0 years (12.0 ttl pk-yrs)    Types: Cigarettes   Smokeless tobacco: Never  Vaping Use   Vaping status: Never Used  Substance and Sexual Activity   Alcohol use: Yes    Comment: rare    Drug use: No   Sexual activity: Not on file  Other Topics Concern   Not on file  Social History Narrative   Not on file   Social Drivers of Health   Financial Resource Strain: Not on file  Food Insecurity: Not on file  Transportation Needs: Not on file  Physical Activity: Not on file  Stress: Not on file  Social Connections: Not on file   Past Surgical History:  Procedure Laterality Date   APPENDECTOMY  AGE 4   CHOLECYSTECTOMY  12/2013   Past Surgical History:  Procedure Laterality Date   APPENDECTOMY  AGE 4   CHOLECYSTECTOMY  12/2013   Past Medical History:  Diagnosis Date   Anxiety 09/10/2021   Chronic low back pain    Depression    Insomnia    Migraines    BP (!) 137/92   Pulse 88   Ht 6' (1.829 m)   Wt 220 lb (99.8 kg)   SpO2 99%   BMI 29.84 kg/m   Opioid Risk Score:   Fall Risk Score:  `1  Depression screen [facility] 2/9     12/15/2022    9:17 AM 12/08/2022    3:24 PM 05/21/2022   10:49 AM 10/31/2021    1:58 PM 10/28/2021    2:21 PM 09/10/2021   10:37 AM 11/19/2020    9:01 AM  Depression screen PHQ 2/9  Decreased Interest 3 0 0 0 3 3 0  Down, Depressed, Hopeless 3 0 0 3 3 3  0  PHQ - 2 Score 6 0 0 3 6 6  0  Altered sleeping 3    3 3    Tired, decreased energy 3    3 3    Change in appetite 0    0 1   Feeling bad or failure about yourself  0    1 3   Trouble concentrating 1    3 3    Moving slowly or fidgety/restless 0    0 0   Suicidal thoughts 1    0 1   PHQ-9 Score 14    16 20    Difficult doing work/chores Extremely dIfficult    Extremely dIfficult Extremely dIfficult       Review of Systems  Musculoskeletal:  Positive for back pain and neck pain.  Neurological:  Negative for tremors.       Tingling       Objective:   Physical Exam  Patient no acute distress Lumbar range of motion is 75% flexion extension lateral bending and rotation.  Negative straight leg raising bilaterally Lateral bending to the right causes the most pain. Normal strength  bilateral hip flexor knee extensor ankle dorsiflexor Ambulates without assistive device no evidence of toe drag or knee instability. Normal sensation bilateral lower extremities Mood and affect are appropriate      Assessment & Plan:   #1.  Lumbar stenosis he does have intermittent lower extremity pain.  Difficult to stay whether or not the cramping represents neurogenic claudication.  He does have stenosis more on  the right side which is his most symptomatic lower extremity. The patient would like to do something more permanent.  We discussed that is difficult to say whether his pain can be improved on a long-term basis with surgery but we will write for his surgical referral.  Other potential interventional pain options would include lumbar medial branch blocks versus lumbar epidural injections to help with low back pain versus lower extremity pain. Have made a referral to [facility].  Patient mentioned an episode of chest pain about 1 or 2 weeks ago.  He has been instructed to reach out to his family physician Dr. Jolinda in regards to this he is in agreement   Random UDS demonstrated Oxycodone  not prescribed by this clinic and not listed on PDMP, it was not disculosed by pt during visit This is a violation of Controlled substance agreement and will result in discharge from this clinic

## 2023-11-29 LAB — TOXASSURE SELECT,+ANTIDEPR,UR

## 2023-12-01 ENCOUNTER — Telehealth: Payer: Self-pay | Admitting: Family Medicine

## 2023-12-01 DIAGNOSIS — Z0279 Encounter for issue of other medical certificate: Secondary | ICD-10-CM

## 2023-12-01 NOTE — Telephone Encounter (Signed)
[employer] faxed FMLA forms to be completed and signed.  Form Fee Paid? (Y/N)       y     If NO, form is placed on front office manager desk to hold until payment received. If YES, then form will be placed in the RX/HH Nurse Coordinators box for completion.  Form will not be processed until payment is received

## 2023-12-02 NOTE — Telephone Encounter (Signed)
PCP completed and signed FMLA forms. They have been faxed to [facility] at fax number [phone]. Patient has been contacted and informed they are complete.

## 2023-12-17 ENCOUNTER — Telehealth: Payer: Self-pay | Admitting: Registered Nurse

## 2023-12-17 ENCOUNTER — Other Ambulatory Visit: Payer: Self-pay | Admitting: Physical Medicine & Rehabilitation

## 2023-12-17 NOTE — Telephone Encounter (Signed)
 Mr. Logan Young requested refill on Tramadol.  Dr Wynn Banker note was reviewed.  UDS: inconsistent, + Oxycodone, not prescribed.  Dr Wynn Banker note reports due to violation of Controlled Substance Agreement, he will be  discharged from our office.  Call placed to Logan Young, no answer. [name] will send him a letter regarding the above and appointment with Dr Wynn Banker was removed.

## 2023-12-17 NOTE — Telephone Encounter (Signed)
 Why does Dr Wynn Banker note states he ad Oxycodone on his UDS and was going to be discharged.  Can you review the drug screen.

## 2023-12-20 ENCOUNTER — Ambulatory Visit: Payer: BC Managed Care – PPO | Admitting: Orthopedic Surgery

## 2023-12-21 NOTE — Telephone Encounter (Signed)
 Letter sent informing Logan Young of discharge from clinic per Dr Wynn Banker note.

## 2023-12-23 ENCOUNTER — Other Ambulatory Visit: Payer: Self-pay | Admitting: Family Medicine

## 2023-12-23 ENCOUNTER — Encounter: Payer: Self-pay | Admitting: Family Medicine

## 2023-12-23 ENCOUNTER — Other Ambulatory Visit: Payer: Self-pay | Admitting: Physical Medicine & Rehabilitation

## 2023-12-23 DIAGNOSIS — R252 Cramp and spasm: Secondary | ICD-10-CM

## 2023-12-23 NOTE — Telephone Encounter (Signed)
 [facility] NTBS Last PE/chronic FU 12/15/22 NO RF sent to pharmacy last OV greater than a year

## 2023-12-23 NOTE — Telephone Encounter (Signed)
Lmtcb to schedule appt Letter mailed

## 2023-12-29 ENCOUNTER — Telehealth: Payer: Self-pay | Admitting: Physical Medicine & Rehabilitation

## 2023-12-29 NOTE — Telephone Encounter (Signed)
 Patients friend called in requesting medication refill on Tramadol , patient uses [facility] , states he is aware patient is dismissed but still has not had an appointment with another provider and needs refill

## 2024-01-04 ENCOUNTER — Telehealth: Payer: Self-pay | Admitting: Physical Medicine & Rehabilitation

## 2024-01-04 ENCOUNTER — Telehealth: Payer: Self-pay | Admitting: Registered Nurse

## 2024-01-04 NOTE — Telephone Encounter (Signed)
 Logan Young called asking for a call from [employer].

## 2024-01-04 NOTE — Telephone Encounter (Signed)
 Letter was reviewed:  States Non-Narcotic Care.  Call was placed to Mr. Smolen, his fiance answered and the above was explained. She states she will let Mr. Nidiffer know.

## 2024-01-04 NOTE — Telephone Encounter (Signed)
 Calling asking for update on tramadol refill.

## 2024-01-04 NOTE — Telephone Encounter (Signed)
 Return [employer] call,  Logan Young was discharged based on  UDS Result + Oxycodone, Dr Wynn Banker note states to discharge patient.  Logan Young stating he received a letter, stating we will cover his medication for 30 days.  Dr Claris Che how would you like to proceed with the above.

## 2024-01-06 ENCOUNTER — Ambulatory Visit: Admitting: Orthopedic Surgery

## 2024-01-06 ENCOUNTER — Other Ambulatory Visit (INDEPENDENT_AMBULATORY_CARE_PROVIDER_SITE_OTHER): Payer: Self-pay

## 2024-01-06 VITALS — BP 126/79 | HR 111 | Ht 72.0 in | Wt 220.0 lb

## 2024-01-06 DIAGNOSIS — M545 Low back pain, unspecified: Secondary | ICD-10-CM

## 2024-01-06 DIAGNOSIS — G8929 Other chronic pain: Secondary | ICD-10-CM

## 2024-01-06 NOTE — Progress Notes (Signed)
 Orthopedic Spine Surgery Office Note  Assessment: Patient is a 43 y.o. male with low back pain with no radicular pain.  Had a disc ration at L5/S1.  Possible discogenic pain   Plan: -Explained that initially conservative treatment is tried as a significant number of patients may experience relief with these treatment modalities. Discussed that the conservative treatments include:  -activity modification  -physical therapy  -over the counter pain medications  -medrol dosepak  -lumbar steroid injections -Patient has tried PT, tylenol, ibuprofen, gabapentin, topical anesthetics, steroid injections, tramadol  -Patient has tried multiple conservative treatments without any relief. Ordered a SPECT scan to work up discogenic pain further -Since pain is similar just worse in terms of intensity since his last MRI scan, would not repeat a MRI scan at this time -Would need to be nicotine free prior to any elective spine surgery -Patient should return to office in 4 weeks, x-rays at next visit: none   Patient expressed understanding of the plan and all questions were answered to the patient's satisfaction.   ___________________________________________________________________________   History:  Patient is a 43 y.o. male who presents today for lumbar spine.  Patient has had several years of low back pain.  He said it started about 8 years ago.  Pain has gotten progressively worse.  He has had to take increasing medication over time.  He is currently using tramadol to control his pain.  He says he takes it at work to help him get through the day.  Otherwise, he does not use it.  He has no pain rating into either lower extremity.  He feels the pain in his lower lumbar spine.   Weakness: Denies Symptoms of imbalance: Denies Paresthesias and numbness: Denies Bowel or bladder incontinence: Denies Saddle anesthesia: Denies  Treatments tried: PT, tylenol, ibuprofen, gabapentin, topical anesthetics,  steroid injections, tramadol   Review of systems: Denies fevers and chills, night sweats, unexplained weight loss, history of cancer.  Has had pain that wakes him at night  Past medical history: Depression/anxiety Chronic low back pain Insomnia Migraines  Allergies: sulfa  Past surgical history:  Cholecystectomy Appendectomy  Social history: Reports use of nicotine product (smoking, vaping, patches, smokeless) Alcohol use: Denies Denies recreational drug use   Physical Exam:  BMI of 29.8  General: no acute distress, appears stated age Neurologic: alert, answering questions appropriately, following commands Respiratory: unlabored breathing on room air, symmetric chest rise Psychiatric: appropriate affect, normal cadence to speech   MSK (spine):  -Strength exam      Left  Right EHL    5/5  5/5 TA    5/5  5/5 GSC    5/5  5/5 Knee extension  5/5  5/5 Hip flexion   5/5  5/5  -Sensory exam    Sensation intact to light touch in L3-S1 nerve distributions of bilateral lower extremities  -Achilles DTR: 2/4 on the left, 2/4 on the right -Patellar tendon DTR: 2/4 on the left, 2/4 on the right  -Straight leg raise: Negative bilaterally -Clonus: no beats bilaterally  -Left hip exam: No pain to range of motion, negative Stinchfield, negative FABER, negative SI joint compression test -Right hip exam: No pain to range of motion, negative Stinchfield, negative FABER, negative SI joint compression test  Imaging: XRs of the lumbar spine from 01/06/2024 were independently reviewed and interpreted, showing disc height loss at L5/S1.  No other significant degenerative changes.  No evidence of instability on flexion/extension views.  No fracture or dislocation seen.  Lordotic alignment.  MRI of the lumbar spine from 03/13/2020 was independently reviewed and interpreted, showing DDD with disc herniation at L5/S1.  It is mostly a right-sided paracentral disc herniation causing lateral  recess stenosis.  No other significant stenosis seen.   Patient name: Logan Young Patient MRN: [id number] Date of visit: 01/06/24

## 2024-01-10 DIAGNOSIS — M545 Low back pain, unspecified: Secondary | ICD-10-CM | POA: Diagnosis not present

## 2024-01-10 DIAGNOSIS — Z79891 Long term (current) use of opiate analgesic: Secondary | ICD-10-CM | POA: Diagnosis not present

## 2024-01-10 DIAGNOSIS — M542 Cervicalgia: Secondary | ICD-10-CM | POA: Diagnosis not present

## 2024-01-10 DIAGNOSIS — G894 Chronic pain syndrome: Secondary | ICD-10-CM | POA: Diagnosis not present

## 2024-02-08 DIAGNOSIS — M542 Cervicalgia: Secondary | ICD-10-CM | POA: Diagnosis not present

## 2024-02-08 DIAGNOSIS — Z79891 Long term (current) use of opiate analgesic: Secondary | ICD-10-CM | POA: Diagnosis not present

## 2024-02-08 DIAGNOSIS — G894 Chronic pain syndrome: Secondary | ICD-10-CM | POA: Diagnosis not present

## 2024-02-08 DIAGNOSIS — M545 Low back pain, unspecified: Secondary | ICD-10-CM | POA: Diagnosis not present

## 2024-02-29 ENCOUNTER — Telehealth: Payer: Self-pay

## 2024-02-29 NOTE — Telephone Encounter (Signed)
 Abe Abed with [facility] called stating that they have tried to contact patient for bone scan, but hasn't been successful.  CB# [phone].  Please advise.

## 2024-04-03 DIAGNOSIS — Z79891 Long term (current) use of opiate analgesic: Secondary | ICD-10-CM | POA: Diagnosis not present

## 2024-04-03 DIAGNOSIS — M545 Low back pain, unspecified: Secondary | ICD-10-CM | POA: Diagnosis not present

## 2024-04-03 DIAGNOSIS — G894 Chronic pain syndrome: Secondary | ICD-10-CM | POA: Diagnosis not present

## 2024-04-03 DIAGNOSIS — M542 Cervicalgia: Secondary | ICD-10-CM | POA: Diagnosis not present

## 2024-05-01 DIAGNOSIS — Z79891 Long term (current) use of opiate analgesic: Secondary | ICD-10-CM | POA: Diagnosis not present

## 2024-05-01 DIAGNOSIS — M542 Cervicalgia: Secondary | ICD-10-CM | POA: Diagnosis not present

## 2024-05-01 DIAGNOSIS — M545 Low back pain, unspecified: Secondary | ICD-10-CM | POA: Diagnosis not present

## 2024-05-01 DIAGNOSIS — G894 Chronic pain syndrome: Secondary | ICD-10-CM | POA: Diagnosis not present

## 2024-05-18 ENCOUNTER — Telehealth: Payer: Self-pay | Admitting: Family Medicine

## 2024-05-18 DIAGNOSIS — Z0279 Encounter for issue of other medical certificate: Secondary | ICD-10-CM

## 2024-05-18 NOTE — Telephone Encounter (Signed)
 I called and left a detailed message making patient aware that we received FMLA paperwork that needs to be filled out for him, but he has to pay the $29 form fee before it can be filled out and sent in.   Will put paperwork in a folder and leave it on office managers desk.

## 2024-05-24 NOTE — Telephone Encounter (Signed)
 PT paid FMLA fee Form put in Cathy's box

## 2024-05-25 ENCOUNTER — Ambulatory Visit: Payer: BC Managed Care – PPO | Admitting: Physical Medicine & Rehabilitation

## 2024-05-26 NOTE — Telephone Encounter (Signed)
 LMOVM informed pt that PCP is unable to fill out his FMLA ppw since she has not seen him since 11/2022, he will have to get a new set of papers and have his Orthopedist fill them out since they have seen him recently in March of this year. Will have the office refund him the fee charge.

## 2024-05-29 ENCOUNTER — Ambulatory Visit: Admitting: Family Medicine

## 2024-05-29 DIAGNOSIS — M545 Low back pain, unspecified: Secondary | ICD-10-CM

## 2024-05-29 NOTE — Progress Notes (Deleted)
 Subjective: CC: Follow-up chronic back pain PCP: Jolinda Norene HERO, DO YEP:Logan Young is a 43 y.o. male presenting to clinic today for:  1.  Chronic back pain without sciatica Patient has not been seen since February 2024.  He had establish care with a pain management center, who was providing tramadol  but apparently was dismissed after he was found to have opiates which were not prescribed to him and his urine specimen.  He was later seen at orthopedics in March of this year and was advised to follow-up 1 month later after imaging could be obtained (SPECT scan).  According to the EMR it looks like they attempted to contact him to schedule the scan but were unsuccessful.  He presents today because he would like me to complete his FMLA paperwork. ***   ROS: Per HPI  Allergies  Allergen Reactions   Misc. Sulfonamide Containing Compounds    Sulfa Antibiotics Itching   Past Medical History:  Diagnosis Date   Anxiety 09/10/2021   Chronic low back pain    Depression    Insomnia    Migraines     Current Outpatient Medications:    baclofen  (LIORESAL ) 10 MG tablet, Take 1 tablet (10 mg total) by mouth 3 (three) times daily as needed (muscle cramping)., Disp: 30 each, Rfl: 0   clindamycin (CLEOCIN) 150 MG capsule, Take 150 mg by mouth 3 (three) times daily., Disp: , Rfl:    DULoxetine  (CYMBALTA ) 30 MG capsule, Take 1 capsule (30 mg total) by mouth daily., Disp: 90 capsule, Rfl: 1   gabapentin  (NEURONTIN ) 300 MG capsule, Take 1 capsule (300 mg total) by mouth at bedtime., Disp: 30 capsule, Rfl: 5   ibuprofen (ADVIL) 800 MG tablet, Take 800 mg by mouth every 8 (eight) hours as needed., Disp: , Rfl:    meloxicam  (MOBIC ) 15 MG tablet, TAKE 1 TABLET BY MOUTH DAILY, Disp: 30 tablet, Rfl: 1   rosuvastatin  (CRESTOR ) 10 MG tablet, Take 1 tablet (10 mg total) by mouth daily., Disp: 90 tablet, Rfl: 3   traMADol  (ULTRAM ) 50 MG tablet, Take 2 tablets (100 mg total) by mouth 4 (four) times daily.,  Disp: 120 tablet, Rfl: 0 Social History   Socioeconomic History   Marital status: Legally Separated    Spouse name: Not on file   Number of children: Not on file   Years of education: Not on file   Highest education level: Not on file  Occupational History   Not on file  Tobacco Use   Smoking status: Every Day    Current packs/day: 1.00    Average packs/day: 1 pack/day for 12.0 years (12.0 ttl pk-yrs)    Types: Cigarettes   Smokeless tobacco: Never  Vaping Use   Vaping status: Never Used  Substance and Sexual Activity   Alcohol use: Yes    Comment: rare   Drug use: No   Sexual activity: Not on file  Other Topics Concern   Not on file  Social History Narrative   Not on file   Social Drivers of Health   Financial Resource Strain: Not on file  Food Insecurity: Not on file  Transportation Needs: Not on file  Physical Activity: Not on file  Stress: Not on file  Social Connections: Not on file  Intimate Partner Violence: Not on file   Family History  Problem Relation Age of Onset   Diabetes Mother    Cancer Mother        rectal   Lupus Father  Cancer Father        stomach    Objective: Office vital signs reviewed. There were no vitals taken for this visit.  Physical Examination:  General: Awake, alert, *** nourished, No acute distress HEENT: Normal    Neck: No masses palpated. No lymphadenopathy    Ears: Tympanic membranes intact, normal light reflex, no erythema, no bulging    Eyes: PERRLA, extraocular membranes intact, sclera ***    Nose: nasal turbinates moist, *** nasal discharge    Throat: moist mucus membranes, no erythema, *** tonsillar exudate.  Airway is patent Cardio: regular rate and rhythm, S1S2 heard, no murmurs appreciated Pulm: clear to auscultation bilaterally, no wheezes, rhonchi or rales; normal work of breathing on room air GI: soft, non-tender, non-distended, bowel sounds present x4, no hepatomegaly, no splenomegaly, no masses GU:  external vaginal tissue ***, cervix ***, *** punctate lesions on cervix appreciated, *** discharge from cervical os, *** bleeding, *** cervical motion tenderness, *** abdominal/ adnexal masses Extremities: warm, well perfused, No edema, cyanosis or clubbing; +*** pulses bilaterally MSK: *** gait and *** station Skin: dry; intact; no rashes or lesions Neuro: *** Strength and light touch sensation grossly intact, *** DTRs ***/4  Assessment/ Plan: 43 y.o. male   Chronic bilateral low back pain without sciatica  ***   Norene CHRISTELLA Fielding, DO [facility] [phone]

## 2024-05-30 ENCOUNTER — Encounter: Payer: Self-pay | Admitting: Family Medicine

## 2024-06-28 ENCOUNTER — Ambulatory Visit: Admitting: Family Medicine

## 2024-07-10 DIAGNOSIS — M545 Low back pain, unspecified: Secondary | ICD-10-CM | POA: Diagnosis not present

## 2024-07-10 DIAGNOSIS — M542 Cervicalgia: Secondary | ICD-10-CM | POA: Diagnosis not present

## 2024-07-10 DIAGNOSIS — G894 Chronic pain syndrome: Secondary | ICD-10-CM | POA: Diagnosis not present

## 2024-07-10 DIAGNOSIS — Z79891 Long term (current) use of opiate analgesic: Secondary | ICD-10-CM | POA: Diagnosis not present

## 2024-07-17 DIAGNOSIS — M2042 Other hammer toe(s) (acquired), left foot: Secondary | ICD-10-CM | POA: Diagnosis not present

## 2024-07-17 DIAGNOSIS — M79672 Pain in left foot: Secondary | ICD-10-CM | POA: Diagnosis not present

## 2024-07-17 DIAGNOSIS — M2041 Other hammer toe(s) (acquired), right foot: Secondary | ICD-10-CM | POA: Diagnosis not present

## 2024-07-17 DIAGNOSIS — L851 Acquired keratosis [keratoderma] palmaris et plantaris: Secondary | ICD-10-CM | POA: Diagnosis not present

## 2024-08-21 ENCOUNTER — Encounter: Payer: Self-pay | Admitting: Radiology
